# Patient Record
Sex: Male | Born: 1950
Health system: Southern US, Community
[De-identification: ages and names within clinical notes are randomized; demographics above are authoritative.]

## PROBLEM LIST (undated history)

## (undated) DIAGNOSIS — C61 Malignant neoplasm of prostate: Secondary | ICD-10-CM

## (undated) DIAGNOSIS — E7849 Other hyperlipidemia: Secondary | ICD-10-CM

## (undated) DIAGNOSIS — J45909 Unspecified asthma, uncomplicated: Secondary | ICD-10-CM

## (undated) DIAGNOSIS — Z8249 Family history of ischemic heart disease and other diseases of the circulatory system: Secondary | ICD-10-CM

## (undated) DIAGNOSIS — R001 Bradycardia, unspecified: Secondary | ICD-10-CM

## (undated) DIAGNOSIS — G5710 Meralgia paresthetica, unspecified lower limb: Secondary | ICD-10-CM

## (undated) DIAGNOSIS — B399 Histoplasmosis, unspecified: Secondary | ICD-10-CM

## (undated) DIAGNOSIS — H9191 Unspecified hearing loss, right ear: Secondary | ICD-10-CM

## (undated) HISTORY — DX: Malignant neoplasm of prostate: C61

## (undated) HISTORY — DX: Meralgia paresthetica, unspecified lower limb: G57.10

## (undated) HISTORY — DX: Histoplasmosis, unspecified: B39.9

## (undated) HISTORY — DX: Unspecified asthma, uncomplicated: J45.909

## (undated) HISTORY — PX: OTHER SURGICAL HISTORY: SHX169

## (undated) HISTORY — DX: Bradycardia, unspecified: R00.1

## (undated) HISTORY — DX: Family history of ischemic heart disease and other diseases of the circulatory system: Z82.49

## (undated) HISTORY — DX: Unspecified hearing loss, right ear: H91.91

## (undated) HISTORY — DX: Other hyperlipidemia: E78.49

---

## 2010-01-08 DIAGNOSIS — C61 Malignant neoplasm of prostate: Secondary | ICD-10-CM

## 2010-01-08 HISTORY — DX: Malignant neoplasm of prostate: C61

## 2012-07-07 DIAGNOSIS — C61 Malignant neoplasm of prostate: Secondary | ICD-10-CM | POA: Insufficient documentation

## 2012-07-07 DIAGNOSIS — N529 Male erectile dysfunction, unspecified: Secondary | ICD-10-CM | POA: Insufficient documentation

## 2013-09-18 ENCOUNTER — Other Ambulatory Visit: Payer: Self-pay | Admitting: Cardiology

## 2013-09-18 DIAGNOSIS — R6889 Other general symptoms and signs: Secondary | ICD-10-CM

## 2013-09-23 ENCOUNTER — Other Ambulatory Visit: Payer: Self-pay

## 2013-09-27 ENCOUNTER — Other Ambulatory Visit: Payer: BC Managed Care – PPO

## 2013-09-27 DIAGNOSIS — R6889 Other general symptoms and signs: Secondary | ICD-10-CM

## 2013-09-28 LAB — VITAMIN D 25 HYDROXY (VIT D DEFICIENCY, FRACTURES): Vit D, 25-Hydroxy: 66 ng/mL (ref 30–89)

## 2013-10-19 ENCOUNTER — Encounter: Payer: Self-pay | Admitting: Cardiology

## 2013-10-21 ENCOUNTER — Encounter: Payer: Self-pay | Admitting: Cardiology

## 2013-10-21 ENCOUNTER — Ambulatory Visit (INDEPENDENT_AMBULATORY_CARE_PROVIDER_SITE_OTHER): Payer: BC Managed Care – PPO | Admitting: Cardiology

## 2013-10-21 VITALS — BP 118/68 | HR 51 | Ht 70.0 in | Wt 162.0 lb

## 2013-10-21 DIAGNOSIS — E7849 Other hyperlipidemia: Secondary | ICD-10-CM

## 2013-10-21 DIAGNOSIS — H9190 Unspecified hearing loss, unspecified ear: Secondary | ICD-10-CM

## 2013-10-21 DIAGNOSIS — E785 Hyperlipidemia, unspecified: Secondary | ICD-10-CM

## 2013-10-21 DIAGNOSIS — H9191 Unspecified hearing loss, right ear: Secondary | ICD-10-CM

## 2013-10-21 DIAGNOSIS — Z8249 Family history of ischemic heart disease and other diseases of the circulatory system: Secondary | ICD-10-CM | POA: Insufficient documentation

## 2013-10-21 DIAGNOSIS — R001 Bradycardia, unspecified: Secondary | ICD-10-CM

## 2013-10-21 DIAGNOSIS — Z0389 Encounter for observation for other suspected diseases and conditions ruled out: Secondary | ICD-10-CM

## 2013-10-21 DIAGNOSIS — I498 Other specified cardiac arrhythmias: Secondary | ICD-10-CM

## 2013-10-21 HISTORY — DX: Other hyperlipidemia: E78.49

## 2013-10-21 HISTORY — DX: Unspecified hearing loss, right ear: H91.91

## 2013-10-21 HISTORY — DX: Family history of ischemic heart disease and other diseases of the circulatory system: Z82.49

## 2013-10-21 HISTORY — DX: Bradycardia, unspecified: R00.1

## 2013-10-21 LAB — LIPID PANEL
HDL: 55.7 mg/dL (ref >39.00)
LDL Cholesterol: 85 mg/dL (ref 0–99)
Total CHOL/HDL Ratio: 3
Triglycerides: 60 mg/dL (ref 0.0–149.0)

## 2013-10-21 NOTE — Patient Instructions (Signed)
Your physician recommends that you have labs today: Lipid  Your physician wants you to follow-up in: 1 year with Dr. Anne Fu. You will receive a reminder letter in the mail two months in advance. If you don't receive a letter, please call our office to schedule the follow-up appointment.

## 2013-10-21 NOTE — Progress Notes (Signed)
      [address] [place], [place]  [address] Phone: [phone] Fax:  [phone]  Date:  10/21/2013   ID:  Rick Love, DOB [birth year], MRN [id number]  PCP:  Ethel Rana   History of Present Illness: Rick Love is a 62 y.o. male familial hyperlipidemia, LDL 255 untreated, family history of CAD with brother having MI at age 55 here for followup.  Nuclear stress test 06/06/11-low risk but no ischemia. ST segments were noted during stress test which overall was false positive.  Crestor. Compliant. Doing well. Familial hyperlipidemia  Earlier this week, BP increased 151/75. He's been exercising. At times notes his heart rate increase to the 170 range. He has aspirations to climb [place]. [place]. Wife is in [place] currently.   Wt Readings from Last 3 Encounters:  10/21/13 162 lb (73.483 kg)     Past Medical History  Diagnosis Date  . Prostate cancer 01/08/2010  . Histoplasmosis   . Asthma   . Meralgia paresthetica     No past surgical history on file.  Current Outpatient Prescriptions  Medication Sig Dispense Refill  . aspirin 81 MG tablet Take 81 mg by mouth daily.      Marland Kitchen azelastine (ASTELIN) 137 MCG/SPRAY nasal spray Place 1 spray into the nose 2 (two) times daily. Use in each nostril as directed      . cetirizine (ZYRTEC) 5 MG tablet Take 5 mg by mouth daily.      . cholecalciferol (VITAMIN D) 1000 UNITS tablet Take by mouth 2 (two) times daily.      . Coenzyme Q10 (CO Q-10) 100 MG CAPS Take by mouth.      . loratadine (CLARITIN) 10 MG tablet Take 10 mg by mouth daily.      . rosuvastatin (CRESTOR) 20 MG tablet Take 20 mg by mouth daily.       No current facility-administered medications for this visit.    Allergies:   No Known Allergies  Social History:  The patient     ROS:  Please see the history of present illness.   Denies chest pain, syncope, orthopnea, PND, bleeding. He did have acute hearing loss right ear which is  improving.  PHYSICAL EXAM: VS:  BP 118/68  Pulse 51  Ht 5\' 10"  (1.778 m)  Wt 162 lb (73.483 kg)  BMI 23.24 kg/m2 Well nourished, well developed, in no acute distress HEENT: normal Neck: no JVD Cardiac:  normal S1, S2; RRR; no murmur Lungs:  clear to auscultation bilaterally, no wheezing, rhonchi or rales Abd: soft, nontender, no hepatomegaly Ext: no edema Skin: warm and dry Neuro: no focal abnormalities noted  EKG:  Sinus bradycardia rate 51 with nonspecific T-wave changes, no changes.  ASSESSMENT AND PLAN:  1. Strong family history of CAD-continue with primary prevention, fitness, exercise. 2. Hyperlipidemia-likely familial-continue with Crestor. Checking lipid profile 3. Sinus bradycardia-normal variant for him. Asymptomatic. 4. Transiently elevated blood pressure. Not hypertension. Continue to monitor. Unknown etiology. 5. Right-sided hearing loss-improving question viral neuritis 6. We will see back in one year.  Signed, Donato Schultz, MD [facility]  10/21/2013 12:23 PM

## 2013-10-28 ENCOUNTER — Other Ambulatory Visit: Payer: Self-pay

## 2013-11-01 NOTE — Progress Notes (Signed)
Left message for patient to call the office

## 2013-11-26 ENCOUNTER — Ambulatory Visit: Payer: Self-pay | Admitting: Cardiology

## 2014-10-28 ENCOUNTER — Encounter: Payer: Self-pay | Admitting: Cardiology

## 2014-10-28 ENCOUNTER — Ambulatory Visit (INDEPENDENT_AMBULATORY_CARE_PROVIDER_SITE_OTHER): Payer: BC Managed Care – PPO | Admitting: Cardiology

## 2014-10-28 VITALS — BP 126/72 | HR 58 | Ht 70.0 in | Wt 165.0 lb

## 2014-10-28 DIAGNOSIS — E7849 Other hyperlipidemia: Secondary | ICD-10-CM

## 2014-10-28 DIAGNOSIS — E785 Hyperlipidemia, unspecified: Secondary | ICD-10-CM

## 2014-10-28 DIAGNOSIS — R001 Bradycardia, unspecified: Secondary | ICD-10-CM

## 2014-10-28 DIAGNOSIS — Z8249 Family history of ischemic heart disease and other diseases of the circulatory system: Secondary | ICD-10-CM

## 2014-10-28 DIAGNOSIS — R5383 Other fatigue: Secondary | ICD-10-CM

## 2014-10-28 NOTE — Progress Notes (Signed)
[address], [address] [place], [place]  [address] Phone: [phone] Fax:  [phone]  Date:  10/28/2014   ID:  Rick Love, DOB [birth year], MRN [id number]  PCP:  Beatris Si   History of Present Illness: Rick Love is a 63 y.o. male familial hyperlipidemia, LDL 72 untreated, family history of CAD with brother having MI at age 76 here for followup.  Nuclear stress test 06/06/11-low risk but no ischemia. ST segments were noted during stress test which overall was false positive.  Crestor. Compliant. Doing well. Familial hyperlipidemia.  Earlier this week, BP increased 151/75. He's been exercising. At times notes his heart rate increase to the 170 range. He has aspirations to climb [place]. [place]. Wife is in [place] currently.  During exercise he has been feeling more fatigued, mild shortness of breath. He climbed [place] and his heart rate was in zone 4 for quite an extended period of time.it has been taking more time for his heart rate to decrease.    Wt Readings from Last 3 Encounters:  10/28/14 165 lb (74.844 kg)  10/21/13 162 lb (73.483 kg)     Past Medical History  Diagnosis Date  . Prostate cancer 01/08/2010  . Histoplasmosis   . Asthma   . Meralgia paresthetica   . Familial hyperlipidemia 10/21/2013    225 untreated LDL  . Family history of ischemic heart disease 10/21/2013  . Hearing loss in right ear 10/21/2013    Transient  . Sinus bradycardia 10/21/2013    Asymptomatic    No past surgical history on file.  Current Outpatient Prescriptions  Medication Sig Dispense Refill  . aspirin 81 MG tablet Take 81 mg by mouth daily.    . cetirizine (ZYRTEC) 5 MG tablet Take 5 mg by mouth daily.    . cholecalciferol (VITAMIN D) 1000 UNITS tablet Take by mouth 2 (two) times daily.    . Coenzyme Q10 (CO Q-10) 100 MG CAPS Take by mouth.    Marland Kitchen ipratropium (ATROVENT) 0.06 % nasal spray     . loratadine (CLARITIN) 10 MG tablet Take 10 mg by mouth  daily.    Marland Kitchen NASONEX 50 MCG/ACT nasal spray     . rosuvastatin (CRESTOR) 20 MG tablet Take 20 mg by mouth daily.    . SYMBICORT 160-4.5 MCG/ACT inhaler      No current facility-administered medications for this visit.    Allergies:   No Known Allergies  Social History:  The patient  reports that he has never smoked. He does not have any smokeless tobacco history on file.   ROS:  Please see the history of present illness.   Denies chest pain, syncope, orthopnea, PND, bleeding. He did have acute hearing loss right ear which is improving.  PHYSICAL EXAM: VS:  BP 126/72 mmHg  Pulse 58  Ht 5\' 10"  (1.778 m)  Wt 165 lb (74.844 kg)  BMI 23.68 kg/m2 Well nourished, well developed, in no acute distress HEENT: normal Neck: no JVD Cardiac:  normal S1, S2; RRR; no murmur Lungs:  clear to auscultation bilaterally, no wheezing, rhonchi or rales Abd: soft, nontender, no hepatomegaly Ext: no edema Skin: warm and dry Neuro: no focal abnormalities noted  EKG:  10/28/14-sinus bradycardia rate 58 with nonspecific ST-T wave changes, previouslySinus bradycardia rate 51 with nonspecific T-wave changes, no changes.  Labs: 10/21/13-LDL 85  ASSESSMENT AND PLAN:  1. Strong family history of CAD-continue with primary prevention, fitness, exercise. 2. Increasing fatigue/heart rate changes-I  will check an exercise treadmill test. Make sure that this is not a manifestation of ischemia. 3. Hyperlipidemia-likely familial-continue with Crestor. LDL 85. Excellent. 4. Sinus bradycardia-normal variant for him. Asymptomatic. 5. Right-sided hearing loss-improving question viral neuritis 6. We will see back in 6 months.  Signed, Candee Furbish, MD [facility]  10/28/2014 2:02 PM

## 2014-10-28 NOTE — Patient Instructions (Signed)
The current medical regimen is effective;  continue present plan and medications.  Your physician has requested that you have an exercise tolerance test. For further information please visit [email or web address]. Please also follow instruction sheet, as given.  Follow up in 6 months with Dr. Marlou Porch.  You will receive a letter in the mail 2 months before you are due.  Please call us when you receive this letter to schedule your follow up appointment.

## 2014-12-08 ENCOUNTER — Encounter: Payer: Self-pay | Admitting: *Deleted

## 2014-12-09 ENCOUNTER — Telehealth: Payer: Self-pay | Admitting: *Deleted

## 2014-12-09 DIAGNOSIS — Z8249 Family history of ischemic heart disease and other diseases of the circulatory system: Secondary | ICD-10-CM

## 2014-12-09 DIAGNOSIS — E785 Hyperlipidemia, unspecified: Secondary | ICD-10-CM

## 2014-12-09 NOTE — Telephone Encounter (Signed)
Pt is scheduled for a GXT on Monday.  He has in the past had a GXT with ST segment changes with exercise.  Due to this, his testing needs to be changed from a regular GXT to a nuclear study.  Left message for pt to call back to discuss.

## 2014-12-12 ENCOUNTER — Encounter: Payer: BC Managed Care – PPO | Admitting: Physician Assistant

## 2014-12-12 NOTE — Telephone Encounter (Signed)
Follow Up ° ° ° ° ° ° ° °Pt returning phone call °

## 2014-12-12 NOTE — Telephone Encounter (Signed)
Pt aware GXT for today should be cancelled and he is to be scheduled for a myoview.  Reviewed instructions over the phone.  He is aware he will be called to schedule the appt.

## 2014-12-15 ENCOUNTER — Ambulatory Visit (HOSPITAL_COMMUNITY): Payer: BC Managed Care – PPO | Attending: Cardiology | Admitting: Radiology

## 2014-12-15 DIAGNOSIS — Z8249 Family history of ischemic heart disease and other diseases of the circulatory system: Secondary | ICD-10-CM

## 2014-12-15 DIAGNOSIS — E785 Hyperlipidemia, unspecified: Secondary | ICD-10-CM

## 2014-12-15 DIAGNOSIS — R0600 Dyspnea, unspecified: Secondary | ICD-10-CM | POA: Diagnosis not present

## 2014-12-15 DIAGNOSIS — Z6822 Body mass index (BMI) 22.0-22.9, adult: Secondary | ICD-10-CM | POA: Diagnosis not present

## 2014-12-15 DIAGNOSIS — Z136 Encounter for screening for cardiovascular disorders: Secondary | ICD-10-CM | POA: Diagnosis present

## 2014-12-15 DIAGNOSIS — R0602 Shortness of breath: Secondary | ICD-10-CM | POA: Insufficient documentation

## 2014-12-15 MED ORDER — TECHNETIUM TC 99M SESTAMIBI GENERIC - CARDIOLITE
30.0000 | Freq: Once | INTRAVENOUS | Status: AC | PRN
  Administered 2014-12-15: 30 via INTRAVENOUS

## 2014-12-15 MED ORDER — TECHNETIUM TC 99M SESTAMIBI GENERIC - CARDIOLITE
10.0000 | Freq: Once | INTRAVENOUS | Status: AC | PRN
  Administered 2014-12-15: 10 via INTRAVENOUS

## 2014-12-15 NOTE — Progress Notes (Signed)
[facility] [address] [place], [place] [address] [phone]    Cardiology Nuclear Med Study  Rick Love is a 63 y.o. male     MRN : [id number]     DOB: 13-May-1951  Procedure Date: 12/15/2014  Nuclear Med Background Indication for Stress Test:  Evaluation for Ischemia History:  No Cardiac History Cardiac Risk Factors: N/A  Symptoms:  DOE and SOB   Nuclear Pre-Procedure Caffeine/Decaff Intake:  None NPO After: 7:00pm   Lungs:  clear O2 Sat: 98% on room air. IV 0.9% NS with Angio Cath:  22g  IV Site: R Hand  IV Started by:  Crissie Figures, RN  Chest Size (in):  42 Cup Size: n/a  Height: 5\' 10"  (1.778 m)  Weight:  158 lb (71.668 kg)  BMI:  Body mass index is 22.67 kg/(m^2). Tech Comments:  N/A    Nuclear Med Study 1 or 2 day study: 1 day  Stress Test Type:  Stress  Reading MD: N/A  Order Authorizing Provider:  Candee Furbish, MD  Resting Radionuclide: Technetium 75m Sestamibi  Resting Radionuclide Dose: 11.0 mCi   Stress Radionuclide:  Technetium 74m Sestamibi  Stress Radionuclide Dose: 33.0 mCi           Stress Protocol Rest HR: 47 Stress HR: 150  Rest BP: 131/77 Stress BP: 162/69  Exercise Time (min): 8:25 METS: 10.1   Predicted Max HR: 159 bpm % Max HR: 94.34 bpm Rate Pressure Product: 24300   Dose of Adenosine (mg):  n/a Dose of Lexiscan: n/a mg  Dose of Atropine (mg): n/a Dose of Dobutamine: n/a mcg/kg/min (at max HR)  Stress Test Technologist: Crissie Figures, RN  Nuclear Technologist:  Vedia Pereyra, CNMT     Rest Procedure:  Myocardial perfusion imaging was performed at rest 45 minutes following the intravenous administration of Technetium 37m Sestamibi. Rest ECG: NSR-LVH  Stress Procedure:  The patient exercised on the treadmill utilizing the Bruce Protocol for 8:25 minutes. The patient stopped due to achieving target heart rate and denied any chest pain.  Technetium 33m Sestamibi was injected at peak exercise and  myocardial perfusion imaging was performed after a brief delay. Stress ECG: Significant ST abnormalities consistent with ischemia.  QPS Raw Data Images:  Normal; no motion artifact; normal heart/lung ratio. Stress Images:  Normal homogeneous uptake in all areas of the myocardium. Rest Images:  Normal homogeneous uptake in all areas of the myocardium. Subtraction (SDS):  No evidence of ischemia. Transient Ischemic Dilatation (Normal <1.22):  1.02 Lung/Heart Ratio (Normal <0.45):  0.31  Quantitative Gated Spect Images QGS EDV:  88 ml QGS ESV:  38 ml  Impression Exercise Capacity:  Fair exercise capacity. BP Response:  Normal blood pressure response. Clinical Symptoms:  No significant symptoms noted. ECG Impression:  Significant ST abnormalities consistent with ischemia. Comparison with Prior Nuclear Study: No images to compare  Overall Impression:  Low risk stress nuclear study Normal EF and perfusion images  2 mm ST segment depression in inferior leads with stress Baseline ECG suggests LVH.  Consider further w/u such as cardiac CT if clinically indicated .  LV Ejection Fraction: 57%.  LV Wall Motion:  NL LV Function; NL Wall Motion   Jenkins [place]

## 2014-12-21 ENCOUNTER — Telehealth: Payer: Self-pay | Admitting: Cardiology

## 2014-12-21 NOTE — Telephone Encounter (Signed)
Reviewed results of stress test with pt who states understanding

## 2014-12-21 NOTE — Telephone Encounter (Signed)
New Msg ° ° ° ° ° ° °Pt returning call. ° ° °Please call back. °

## 2015-09-02 DIAGNOSIS — J309 Allergic rhinitis, unspecified: Secondary | ICD-10-CM

## 2015-09-02 DIAGNOSIS — K219 Gastro-esophageal reflux disease without esophagitis: Secondary | ICD-10-CM

## 2015-09-02 DIAGNOSIS — H1010 Acute atopic conjunctivitis, unspecified eye: Secondary | ICD-10-CM

## 2015-09-02 DIAGNOSIS — J45909 Unspecified asthma, uncomplicated: Secondary | ICD-10-CM | POA: Insufficient documentation

## 2015-09-20 ENCOUNTER — Ambulatory Visit (INDEPENDENT_AMBULATORY_CARE_PROVIDER_SITE_OTHER): Payer: BLUE CROSS/BLUE SHIELD | Admitting: *Deleted

## 2015-09-20 DIAGNOSIS — H1010 Acute atopic conjunctivitis, unspecified eye: Secondary | ICD-10-CM | POA: Diagnosis not present

## 2015-09-20 DIAGNOSIS — J309 Allergic rhinitis, unspecified: Secondary | ICD-10-CM

## 2015-09-25 ENCOUNTER — Other Ambulatory Visit: Payer: Self-pay | Admitting: *Deleted

## 2015-09-25 MED ORDER — MOMETASONE FUROATE 50 MCG/ACT NA SUSP: 1.0000 | Freq: Two times a day (BID) | NASAL | Status: DC

## 2015-10-03 ENCOUNTER — Ambulatory Visit (INDEPENDENT_AMBULATORY_CARE_PROVIDER_SITE_OTHER): Payer: BLUE CROSS/BLUE SHIELD

## 2015-10-03 DIAGNOSIS — J309 Allergic rhinitis, unspecified: Secondary | ICD-10-CM | POA: Diagnosis not present

## 2015-10-03 DIAGNOSIS — H1010 Acute atopic conjunctivitis, unspecified eye: Secondary | ICD-10-CM

## 2015-10-17 ENCOUNTER — Ambulatory Visit (INDEPENDENT_AMBULATORY_CARE_PROVIDER_SITE_OTHER): Payer: BLUE CROSS/BLUE SHIELD

## 2015-10-17 DIAGNOSIS — J309 Allergic rhinitis, unspecified: Secondary | ICD-10-CM

## 2015-10-31 ENCOUNTER — Ambulatory Visit (INDEPENDENT_AMBULATORY_CARE_PROVIDER_SITE_OTHER): Payer: BLUE CROSS/BLUE SHIELD | Admitting: *Deleted

## 2015-10-31 DIAGNOSIS — J309 Allergic rhinitis, unspecified: Secondary | ICD-10-CM

## 2015-11-02 DIAGNOSIS — J3089 Other allergic rhinitis: Secondary | ICD-10-CM

## 2015-11-03 DIAGNOSIS — J3081 Allergic rhinitis due to animal (cat) (dog) hair and dander: Secondary | ICD-10-CM

## 2015-11-21 ENCOUNTER — Ambulatory Visit (INDEPENDENT_AMBULATORY_CARE_PROVIDER_SITE_OTHER): Payer: BLUE CROSS/BLUE SHIELD

## 2015-11-21 DIAGNOSIS — J309 Allergic rhinitis, unspecified: Secondary | ICD-10-CM | POA: Diagnosis not present

## 2015-12-06 ENCOUNTER — Encounter: Payer: Self-pay | Admitting: Allergy and Immunology

## 2015-12-06 ENCOUNTER — Ambulatory Visit (INDEPENDENT_AMBULATORY_CARE_PROVIDER_SITE_OTHER): Payer: BLUE CROSS/BLUE SHIELD | Admitting: Allergy and Immunology

## 2015-12-06 VITALS — BP 130/80 | HR 64 | Resp 16

## 2015-12-06 DIAGNOSIS — K219 Gastro-esophageal reflux disease without esophagitis: Secondary | ICD-10-CM

## 2015-12-06 DIAGNOSIS — Z23 Encounter for immunization: Secondary | ICD-10-CM | POA: Diagnosis not present

## 2015-12-06 DIAGNOSIS — J387 Other diseases of larynx: Secondary | ICD-10-CM

## 2015-12-06 DIAGNOSIS — J4520 Mild intermittent asthma, uncomplicated: Secondary | ICD-10-CM

## 2015-12-06 DIAGNOSIS — J309 Allergic rhinitis, unspecified: Secondary | ICD-10-CM | POA: Diagnosis not present

## 2015-12-06 DIAGNOSIS — H1010 Acute atopic conjunctivitis, unspecified eye: Secondary | ICD-10-CM

## 2015-12-06 MED ORDER — ALBUTEROL SULFATE HFA 108 (90 BASE) MCG/ACT IN AERS: INHALATION_SPRAY | RESPIRATORY_TRACT | Status: AC

## 2015-12-06 NOTE — Patient Instructions (Addendum)
  1. Flu vaccine administered in clinic today  2. Continue omeprazole 40 mg in the morning and ranitidine 300 mg in the evening  3. Continue Nasonex one-2 sprays each nostril one time per day  4. Continue nasal ipratropium 0.06% 2 sprays each nostril every 6 hours if needed  5. Continue Zyrtec, Claritin, Proventil HFA if needed  6. Continue immunotherapy and EpiPen  7. Return to clinic in 1 year or earlier if problem

## 2015-12-06 NOTE — Progress Notes (Addendum)
[facility]  Follow-up Note  Refering Provider: Corine Shelter, PA-C Primary Provider: Beatris Si  Subjective:   Rick Love is a 64 y.o. male who returns to the [facility] [place] in re-evaluation of the following:  HPI Comments:  Patric returns to this clinic on 12/15/2015 in reevaluation of his allergic rhinoconjunctivitis treated with immunotherapy. This is been his best year ever for his allergies. He continues to use immunotherapy every 3 weeks has had very little problems with his nose as long as he continues to use an antihistamine at nighttime. He uses Zyrtec and Claritin. He also continues to use Nasonex one spray shot also one time per day. In the morning he uses nasal ipratropium spray which works great and drying up his nose. Prior to using this now spray he had to use a box of tissues when he ate food but now he has no problem at all. He's had very little problems with his chest. Occasionally he'll get some sneezing and then he thinks he may get some wheezing about every third day for which he'll occasionally take a Benadryl. He does not use a short acting bronchodilator. He can exercise. It should be noted that he did have histoplasmosis during childhood and he's had a persistently depressed spirometry ever since he is been seen in this clinic which does not correlate with his lack of symptoms regarding bronchospastic disease and lack of exacerbations regarding bronchospastic disease   Outpatient Encounter Prescriptions as of 12/06/2015  Medication Sig  . aspirin 81 MG tablet Take 81 mg by mouth daily.  . cetirizine (ZYRTEC) 5 MG tablet Take 5 mg by mouth daily.  . cholecalciferol (VITAMIN D) 1000 UNITS tablet Take by mouth 2 (two) times daily.  Marland Kitchen EPINEPHrine (EPIPEN 2-PAK) 0.3 mg/0.3 mL IJ SOAJ injection Inject 0.3 mg into the muscle once.  Marland Kitchen ipratropium (ATROVENT) 0.06 % nasal spray   . loratadine  (CLARITIN) 10 MG tablet Take 10 mg by mouth daily.  . mometasone (NASONEX) 50 MCG/ACT nasal spray Place 1 spray into the nose 2 (two) times daily.  Marland Kitchen omeprazole (PRILOSEC) 40 MG capsule Take 40 mg by mouth daily.  . ranitidine (ZANTAC) 300 MG tablet Take 300 mg by mouth at bedtime.  . rosuvastatin (CRESTOR) 20 MG tablet Take 20 mg by mouth daily.  . SYMBICORT 160-4.5 MCG/ACT inhaler   . [DISCONTINUED] Umeclidinium-Vilanterol (ANORO ELLIPTA) 62.5-25 MCG/INH AEPB Inhale 1 puff into the lungs daily.  Marland Kitchen albuterol (PROVENTIL HFA) 108 (90 BASE) MCG/ACT inhaler INHALE TWO PUFFS EVERY FOUR TO SIX HOURS AS NEEDED FOR COUGH OR WHEEZE.  Marland Kitchen Coenzyme Q10 (CO Q-10) 100 MG CAPS Take by mouth. Reported on 12/06/2015   No facility-administered encounter medications on file as of 12/06/2015.    Meds ordered this encounter  Medications  . albuterol (PROVENTIL HFA) 108 (90 BASE) MCG/ACT inhaler    Sig: INHALE TWO PUFFS EVERY FOUR TO SIX HOURS AS NEEDED FOR COUGH OR WHEEZE.    Dispense:  1 Inhaler    Refill:  1    Past Medical History  Diagnosis Date  . Prostate cancer ([place]) 01/08/2010  . Histoplasmosis   . Asthma   . Meralgia paresthetica   . Familial hyperlipidemia 10/21/2013    225 untreated LDL  . Family history of ischemic heart disease 10/21/2013  . Hearing loss in right ear 10/21/2013    Transient  . Sinus bradycardia 10/21/2013    Asymptomatic  Past Surgical History  Procedure Laterality Date  . No surgical hx      No Known Allergies  Review of Systems  Constitutional: Negative.   HENT: Negative.   Eyes: Negative.   Respiratory: Negative.   Cardiovascular: Negative.   Gastrointestinal: Negative.   Musculoskeletal: Negative.   Skin: Negative.      Objective:   Filed Vitals:   12/06/15 1105  BP: 130/80  Pulse: 64  Resp: 16          Physical Exam  Constitutional: He appears well-developed and well-nourished. No distress.  HENT:  Head: Normocephalic and  atraumatic. Head is without right periorbital erythema and without left periorbital erythema.  Right Ear: Tympanic membrane, external ear and ear canal normal. No drainage or tenderness. No foreign bodies. Tympanic membrane is not injected, not scarred, not perforated, not erythematous, not retracted and not bulging. No middle ear effusion.  Left Ear: Tympanic membrane, external ear and ear canal normal. No drainage or tenderness. No foreign bodies. Tympanic membrane is not injected, not scarred, not perforated, not erythematous, not retracted and not bulging.  No middle ear effusion.  Nose: Nose normal. No mucosal edema, rhinorrhea, nose lacerations or sinus tenderness.  No foreign bodies.  Mouth/Throat: Oropharynx is clear and moist. No oropharyngeal exudate, posterior oropharyngeal edema, posterior oropharyngeal erythema or tonsillar abscesses.  Eyes: Lids are normal. Right eye exhibits no chemosis, no discharge and no exudate. No foreign body present in the right eye. Left eye exhibits no chemosis, no discharge and no exudate. No foreign body present in the left eye. Right conjunctiva is not injected. Left conjunctiva is not injected.  Neck: Neck supple. No tracheal tenderness present. No tracheal deviation and no edema present. No thyroid mass and no thyromegaly present.  Cardiovascular: Normal rate, regular rhythm, S1 normal and S2 normal.  Exam reveals no gallop.   No murmur heard. Pulmonary/Chest: No accessory muscle usage or stridor. No respiratory distress. He has no wheezes. He has no rhonchi. He has no rales.  Abdominal: Soft.  Lymphadenopathy:       Head (right side): No tonsillar adenopathy present.       Head (left side): No tonsillar adenopathy present.    He has no cervical adenopathy.  Neurological: He is alert.  Skin: No rash noted. He is not diaphoretic.  Psychiatric: He has a normal mood and affect. His behavior is normal.    Diagnostics:    Spirometry was performed and  demonstrated an FEV1 of 2.44 at 75 % of predicted.  The patient had an Asthma Control Test with the following results: ACT Total Score: 23.    Assessment and Plan:   1. Allergic rhinoconjunctivitis   2. Laryngopharyngeal reflux (LPR)   3. Asthma, mild intermittent, uncomplicated   4. Need for prophylactic vaccination and inoculation against influenza      1. Flu vaccine administered in clinic today  2. Continue omeprazole 40 mg in the morning and ranitidine 300 mg in the evening  3. Continue Nasonex one-2 sprays each nostril one time per day  4. Continue nasal ipratropium 0.06% 2 sprays each nostril every 6 hours if needed  5. Continue Zyrtec, Claritin, Proventil HFA if needed  6. Continue immunotherapy and EpiPen  7. Return to clinic in 1 year or earlier if problem  Overall Olufemi is doing quite well on his current medical therapy and we'll continue to have him use therapy directed against his atopic disease including immunotherapy and therapy directed against reflux. I  will see him back in this clinic in 6 months or earlier if there is a problem.   Allena Katz, MD [facility]

## 2015-12-12 ENCOUNTER — Ambulatory Visit (INDEPENDENT_AMBULATORY_CARE_PROVIDER_SITE_OTHER): Payer: BLUE CROSS/BLUE SHIELD

## 2015-12-12 DIAGNOSIS — J309 Allergic rhinitis, unspecified: Secondary | ICD-10-CM | POA: Diagnosis not present

## 2016-01-06 ENCOUNTER — Encounter: Payer: Self-pay | Admitting: Allergy and Immunology

## 2016-01-08 ENCOUNTER — Other Ambulatory Visit: Payer: Self-pay

## 2016-01-08 ENCOUNTER — Other Ambulatory Visit: Payer: Self-pay | Admitting: *Deleted

## 2016-01-08 MED ORDER — IPRATROPIUM BROMIDE 0.06 % NA SOLN: 2.0000 | Freq: Four times a day (QID) | NASAL | Status: DC | PRN

## 2016-01-08 MED ORDER — OMEPRAZOLE 40 MG PO CPDR: 40.0000 mg | DELAYED_RELEASE_CAPSULE | Freq: Every day | ORAL | Status: DC

## 2016-01-08 NOTE — Telephone Encounter (Signed)
Called and left message for patient advising Rx sent to [facility] for Ipratropium nasal

## 2016-01-23 ENCOUNTER — Ambulatory Visit (INDEPENDENT_AMBULATORY_CARE_PROVIDER_SITE_OTHER): Payer: BLUE CROSS/BLUE SHIELD

## 2016-01-23 DIAGNOSIS — J309 Allergic rhinitis, unspecified: Secondary | ICD-10-CM

## 2016-02-13 ENCOUNTER — Ambulatory Visit (INDEPENDENT_AMBULATORY_CARE_PROVIDER_SITE_OTHER): Payer: BLUE CROSS/BLUE SHIELD

## 2016-02-13 DIAGNOSIS — J309 Allergic rhinitis, unspecified: Secondary | ICD-10-CM

## 2016-02-20 ENCOUNTER — Ambulatory Visit (INDEPENDENT_AMBULATORY_CARE_PROVIDER_SITE_OTHER): Payer: BLUE CROSS/BLUE SHIELD

## 2016-02-20 DIAGNOSIS — J309 Allergic rhinitis, unspecified: Secondary | ICD-10-CM

## 2016-02-28 ENCOUNTER — Ambulatory Visit (INDEPENDENT_AMBULATORY_CARE_PROVIDER_SITE_OTHER): Payer: BLUE CROSS/BLUE SHIELD

## 2016-02-28 DIAGNOSIS — J309 Allergic rhinitis, unspecified: Secondary | ICD-10-CM

## 2016-03-05 ENCOUNTER — Ambulatory Visit (INDEPENDENT_AMBULATORY_CARE_PROVIDER_SITE_OTHER): Payer: BLUE CROSS/BLUE SHIELD

## 2016-03-05 DIAGNOSIS — J309 Allergic rhinitis, unspecified: Secondary | ICD-10-CM

## 2016-03-12 ENCOUNTER — Ambulatory Visit (INDEPENDENT_AMBULATORY_CARE_PROVIDER_SITE_OTHER): Payer: BLUE CROSS/BLUE SHIELD

## 2016-03-12 DIAGNOSIS — J309 Allergic rhinitis, unspecified: Secondary | ICD-10-CM | POA: Diagnosis not present

## 2016-03-13 ENCOUNTER — Other Ambulatory Visit: Payer: Self-pay | Admitting: Allergy and Immunology

## 2016-03-28 DIAGNOSIS — D126 Benign neoplasm of colon, unspecified: Secondary | ICD-10-CM | POA: Insufficient documentation

## 2016-04-02 ENCOUNTER — Ambulatory Visit (INDEPENDENT_AMBULATORY_CARE_PROVIDER_SITE_OTHER): Payer: BLUE CROSS/BLUE SHIELD | Admitting: *Deleted

## 2016-04-02 DIAGNOSIS — J309 Allergic rhinitis, unspecified: Secondary | ICD-10-CM | POA: Diagnosis not present

## 2016-04-23 ENCOUNTER — Ambulatory Visit (INDEPENDENT_AMBULATORY_CARE_PROVIDER_SITE_OTHER): Payer: BLUE CROSS/BLUE SHIELD

## 2016-04-23 DIAGNOSIS — J309 Allergic rhinitis, unspecified: Secondary | ICD-10-CM | POA: Diagnosis not present

## 2016-05-09 DIAGNOSIS — J3089 Other allergic rhinitis: Secondary | ICD-10-CM | POA: Diagnosis not present

## 2016-05-10 DIAGNOSIS — J3081 Allergic rhinitis due to animal (cat) (dog) hair and dander: Secondary | ICD-10-CM | POA: Diagnosis not present

## 2016-05-14 ENCOUNTER — Ambulatory Visit (INDEPENDENT_AMBULATORY_CARE_PROVIDER_SITE_OTHER): Payer: BLUE CROSS/BLUE SHIELD | Admitting: *Deleted

## 2016-05-14 DIAGNOSIS — J309 Allergic rhinitis, unspecified: Secondary | ICD-10-CM | POA: Diagnosis not present

## 2016-06-04 ENCOUNTER — Ambulatory Visit (INDEPENDENT_AMBULATORY_CARE_PROVIDER_SITE_OTHER): Payer: BLUE CROSS/BLUE SHIELD | Admitting: *Deleted

## 2016-06-04 DIAGNOSIS — J309 Allergic rhinitis, unspecified: Secondary | ICD-10-CM

## 2016-06-26 ENCOUNTER — Ambulatory Visit (INDEPENDENT_AMBULATORY_CARE_PROVIDER_SITE_OTHER): Payer: BLUE CROSS/BLUE SHIELD

## 2016-06-26 DIAGNOSIS — J309 Allergic rhinitis, unspecified: Secondary | ICD-10-CM

## 2016-07-16 ENCOUNTER — Ambulatory Visit (INDEPENDENT_AMBULATORY_CARE_PROVIDER_SITE_OTHER): Payer: BLUE CROSS/BLUE SHIELD

## 2016-07-16 DIAGNOSIS — J309 Allergic rhinitis, unspecified: Secondary | ICD-10-CM

## 2016-08-06 ENCOUNTER — Ambulatory Visit (INDEPENDENT_AMBULATORY_CARE_PROVIDER_SITE_OTHER): Payer: BLUE CROSS/BLUE SHIELD | Admitting: *Deleted

## 2016-08-06 DIAGNOSIS — J309 Allergic rhinitis, unspecified: Secondary | ICD-10-CM | POA: Diagnosis not present

## 2016-08-07 DIAGNOSIS — J3089 Other allergic rhinitis: Secondary | ICD-10-CM | POA: Diagnosis not present

## 2016-08-08 DIAGNOSIS — J3081 Allergic rhinitis due to animal (cat) (dog) hair and dander: Secondary | ICD-10-CM | POA: Diagnosis not present

## 2016-08-13 ENCOUNTER — Ambulatory Visit (INDEPENDENT_AMBULATORY_CARE_PROVIDER_SITE_OTHER): Payer: BLUE CROSS/BLUE SHIELD

## 2016-08-13 DIAGNOSIS — J309 Allergic rhinitis, unspecified: Secondary | ICD-10-CM | POA: Diagnosis not present

## 2016-08-21 ENCOUNTER — Ambulatory Visit (INDEPENDENT_AMBULATORY_CARE_PROVIDER_SITE_OTHER): Payer: BLUE CROSS/BLUE SHIELD | Admitting: *Deleted

## 2016-08-21 DIAGNOSIS — J309 Allergic rhinitis, unspecified: Secondary | ICD-10-CM | POA: Diagnosis not present

## 2016-08-27 ENCOUNTER — Ambulatory Visit (INDEPENDENT_AMBULATORY_CARE_PROVIDER_SITE_OTHER): Payer: BLUE CROSS/BLUE SHIELD

## 2016-08-27 DIAGNOSIS — J309 Allergic rhinitis, unspecified: Secondary | ICD-10-CM

## 2016-09-03 ENCOUNTER — Ambulatory Visit (INDEPENDENT_AMBULATORY_CARE_PROVIDER_SITE_OTHER): Payer: BLUE CROSS/BLUE SHIELD | Admitting: *Deleted

## 2016-09-03 DIAGNOSIS — J309 Allergic rhinitis, unspecified: Secondary | ICD-10-CM | POA: Diagnosis not present

## 2016-09-24 ENCOUNTER — Ambulatory Visit (INDEPENDENT_AMBULATORY_CARE_PROVIDER_SITE_OTHER): Payer: BLUE CROSS/BLUE SHIELD

## 2016-09-24 DIAGNOSIS — J309 Allergic rhinitis, unspecified: Secondary | ICD-10-CM

## 2016-10-22 ENCOUNTER — Ambulatory Visit (INDEPENDENT_AMBULATORY_CARE_PROVIDER_SITE_OTHER): Payer: BLUE CROSS/BLUE SHIELD

## 2016-10-22 DIAGNOSIS — J309 Allergic rhinitis, unspecified: Secondary | ICD-10-CM

## 2016-11-07 ENCOUNTER — Ambulatory Visit (INDEPENDENT_AMBULATORY_CARE_PROVIDER_SITE_OTHER): Payer: BLUE CROSS/BLUE SHIELD | Admitting: *Deleted

## 2016-11-07 DIAGNOSIS — J309 Allergic rhinitis, unspecified: Secondary | ICD-10-CM

## 2016-11-26 ENCOUNTER — Ambulatory Visit: Payer: BLUE CROSS/BLUE SHIELD | Admitting: Allergy and Immunology

## 2016-12-04 ENCOUNTER — Ambulatory Visit: Payer: Self-pay

## 2016-12-04 ENCOUNTER — Ambulatory Visit (INDEPENDENT_AMBULATORY_CARE_PROVIDER_SITE_OTHER): Payer: BLUE CROSS/BLUE SHIELD | Admitting: Allergy and Immunology

## 2016-12-04 ENCOUNTER — Encounter: Payer: Self-pay | Admitting: Allergy and Immunology

## 2016-12-04 VITALS — BP 128/70 | HR 60 | Resp 16

## 2016-12-04 DIAGNOSIS — J4520 Mild intermittent asthma, uncomplicated: Secondary | ICD-10-CM | POA: Diagnosis not present

## 2016-12-04 DIAGNOSIS — J3089 Other allergic rhinitis: Secondary | ICD-10-CM

## 2016-12-04 DIAGNOSIS — J309 Allergic rhinitis, unspecified: Secondary | ICD-10-CM

## 2016-12-04 NOTE — Progress Notes (Signed)
Follow-up Note  Referring Provider: Corine Shelter, PA-C Primary Provider: Beatris Si Date of Office Visit: 12/04/2016  Subjective:   Rick Love (DOB: [birth year]) is a 65 y.o. male who returns to the [facility] on 12/04/2016 in re-evaluation of the following:  HPI: Rick Love returns to this clinic in reevaluation of his allergic rhinoconjunctivitis and distant history of asthma treated with immunotherapy. I last seen him in his clinic in 1 year ago.  He has had very little problems with his airway. He has not required a systemic steroid or an antibiotic to treat a problem with his airway. He did unfortunately contract influenza in October and was quite sick for about 10 days or so and had a lingering cough that when on for almost 2 months. He's basically resolved this issue at this point in time. He did not need to use a short acting bronchodilator during his acute illness but when he started to exercise on a treadmill he did use a short acting bronchodilator as he did have a little bit of cough and this did appear to help. Now he can exercise and not use a short acting bronchodilator. He's always been a little bit limited in his ability to exercise. This is been believed secondary to his pectus excavatum and his history of histoplasmosis during childhood.  His immunotherapy is going quite well. He's currently receiving immunotherapy every 3 weeks. He has not had any adverse effects as a result of receiving this form of treatment.  He continues to use nasal ipratropium which is working quite well regarding his chronic rhinorrhea. He also continues on Nasonex and antihistamines which is working well for his upper airway atopic disease.  He stopped his ipratropium and ranitidine as this really didn't help his throat clearing to any large degree and he does not have any classic reflux symptoms except when he overeats. He is using ranitidine now as needed which is averaging  out to about twice a month.  He did receive the flu vaccine this year    Medication List      albuterol 108 (90 Base) MCG/ACT inhaler Commonly known as:  PROVENTIL HFA INHALE TWO PUFFS EVERY FOUR TO SIX HOURS AS NEEDED FOR COUGH OR WHEEZE.   aspirin 81 MG tablet Take 81 mg by mouth daily.   cetirizine 5 MG tablet Commonly known as:  ZYRTEC Take 5 mg by mouth daily.   cholecalciferol 1000 units tablet Commonly known as:  VITAMIN D Take by mouth 2 (two) times daily.   EPIPEN 2-PAK 0.3 mg/0.3 mL Soaj injection Generic drug:  EPINEPHrine Inject 0.3 mg into the muscle once.   ipratropium 0.06 % nasal spray Commonly known as:  ATROVENT Place 2 sprays into both nostrils every 6 (six) hours as needed for rhinitis.   loratadine 10 MG tablet Commonly known as:  CLARITIN Take 10 mg by mouth daily.   mometasone 50 MCG/ACT nasal spray Commonly known as:  NASONEX USE ONE SPRAY NASALLY TWICE A DAY   omeprazole 40 MG capsule Commonly known as:  PRILOSEC Take 1 capsule (40 mg total) by mouth daily.   ranitidine 300 MG tablet Commonly known as:  ZANTAC Take 300 mg by mouth at bedtime.   rosuvastatin 20 MG tablet Commonly known as:  CRESTOR Take 20 mg by mouth daily.       Past Medical History:  Diagnosis Date  . Asthma   . Familial hyperlipidemia 10/21/2013   225 untreated LDL  .  Family history of ischemic heart disease 10/21/2013  . Hearing loss in right ear 10/21/2013   Transient  . Histoplasmosis   . Meralgia paresthetica   . Prostate cancer ([place]) 01/08/2010  . Sinus bradycardia 10/21/2013   Asymptomatic    Past Surgical History:  Procedure Laterality Date  . no surgical hx      Allergies  Allergen Reactions  . Tape Rash    Review of systems negative except as noted in HPI / PMHx or noted below:  Review of Systems  Constitutional: Negative.   HENT: Negative.   Eyes: Negative.   Respiratory: Negative.   Cardiovascular: Negative.     Gastrointestinal: Negative.   Genitourinary: Negative.   Musculoskeletal: Negative.   Skin: Negative.   Neurological: Negative.   Endo/Heme/Allergies: Negative.   Psychiatric/Behavioral: Negative.      Objective:   Vitals:   12/04/16 1101  BP: 128/70  Pulse: 60  Resp: 16          Physical Exam  Constitutional: He is well-developed, well-nourished, and in no distress.  HENT:  Head: Normocephalic.  Right Ear: Tympanic membrane, external ear and ear canal normal.  Left Ear: Tympanic membrane, external ear and ear canal normal.  Nose: Nose normal. No mucosal edema or rhinorrhea.  Mouth/Throat: Uvula is midline, oropharynx is clear and moist and mucous membranes are normal. No oropharyngeal exudate.  Eyes: Conjunctivae are normal.  Neck: Trachea normal. No tracheal tenderness present. No tracheal deviation present. No thyromegaly present.  Cardiovascular: Normal rate, regular rhythm, S1 normal, S2 normal and normal heart sounds.   No murmur heard. Pulmonary/Chest: Breath sounds normal. No stridor. No respiratory distress. He has no wheezes. He has no rales.  Pectus excavatum  Musculoskeletal: He exhibits no edema.  Lymphadenopathy:       Head (right side): No tonsillar adenopathy present.       Head (left side): No tonsillar adenopathy present.    He has no cervical adenopathy.  Neurological: He is alert. Gait normal.  Skin: No rash noted. He is not diaphoretic. No erythema. Nails show no clubbing.  Psychiatric: Mood and affect normal.    Diagnostics: None    Assessment and Plan:   1. Other allergic rhinitis   2. Asthma, mild intermittent, well-controlled     1. Continue Nasonex one-2 sprays each nostril one time per day  2. Continue nasal ipratropium 0.06% 2 sprays each nostril every 6 hours if needed  3. Continue Zyrtec, Claritin, Proventil HFA if needed  4. Continue immunotherapy and EpiPen  5. Return to clinic in 1 year or earlier if problem  Rick Love  appears to be doing relatively well at this point in time and we will continue to have him use the therapy mentioned above which includes immunotherapy. I will see him back in this clinic in 1 year or earlier if there is a problem.  Allena Katz, MD [place]

## 2016-12-04 NOTE — Patient Instructions (Addendum)
  1. Continue Nasonex one-2 sprays each nostril one time per day  2. Continue nasal ipratropium 0.06% 2 sprays each nostril every 6 hours if needed  3. Continue Zyrtec, Claritin, Proventil HFA if needed  4. Continue immunotherapy and EpiPen  5. Return to clinic in 1 year or earlier if problem

## 2016-12-24 ENCOUNTER — Ambulatory Visit (INDEPENDENT_AMBULATORY_CARE_PROVIDER_SITE_OTHER): Payer: BLUE CROSS/BLUE SHIELD | Admitting: *Deleted

## 2016-12-24 DIAGNOSIS — J309 Allergic rhinitis, unspecified: Secondary | ICD-10-CM | POA: Diagnosis not present

## 2017-01-14 ENCOUNTER — Ambulatory Visit (INDEPENDENT_AMBULATORY_CARE_PROVIDER_SITE_OTHER): Payer: BLUE CROSS/BLUE SHIELD | Admitting: *Deleted

## 2017-01-14 DIAGNOSIS — J309 Allergic rhinitis, unspecified: Secondary | ICD-10-CM | POA: Diagnosis not present

## 2017-01-15 DIAGNOSIS — M9903 Segmental and somatic dysfunction of lumbar region: Secondary | ICD-10-CM | POA: Diagnosis not present

## 2017-01-15 DIAGNOSIS — M5137 Other intervertebral disc degeneration, lumbosacral region: Secondary | ICD-10-CM | POA: Diagnosis not present

## 2017-01-15 DIAGNOSIS — M9904 Segmental and somatic dysfunction of sacral region: Secondary | ICD-10-CM | POA: Diagnosis not present

## 2017-01-15 DIAGNOSIS — M5431 Sciatica, right side: Secondary | ICD-10-CM | POA: Diagnosis not present

## 2017-01-15 NOTE — Addendum Note (Signed)
Addended byOralia Rud M on: 01/15/2017 02:54 PM   Modules accepted: Orders

## 2017-01-15 NOTE — Addendum Note (Signed)
Addended bySkeet Simmer, Williams Dietrick M on: 01/15/2017 03:00 PM   Modules accepted: Orders

## 2017-01-15 NOTE — Addendum Note (Signed)
Addended byOralia Rud M on: 01/15/2017 03:05 PM   Modules accepted: Orders

## 2017-01-17 DIAGNOSIS — M5431 Sciatica, right side: Secondary | ICD-10-CM | POA: Diagnosis not present

## 2017-01-17 DIAGNOSIS — M9904 Segmental and somatic dysfunction of sacral region: Secondary | ICD-10-CM | POA: Diagnosis not present

## 2017-01-17 DIAGNOSIS — M9903 Segmental and somatic dysfunction of lumbar region: Secondary | ICD-10-CM | POA: Diagnosis not present

## 2017-01-17 DIAGNOSIS — M5137 Other intervertebral disc degeneration, lumbosacral region: Secondary | ICD-10-CM | POA: Diagnosis not present

## 2017-01-21 ENCOUNTER — Ambulatory Visit (INDEPENDENT_AMBULATORY_CARE_PROVIDER_SITE_OTHER): Payer: BLUE CROSS/BLUE SHIELD

## 2017-01-21 DIAGNOSIS — J309 Allergic rhinitis, unspecified: Secondary | ICD-10-CM | POA: Diagnosis not present

## 2017-01-28 ENCOUNTER — Ambulatory Visit (INDEPENDENT_AMBULATORY_CARE_PROVIDER_SITE_OTHER): Payer: BLUE CROSS/BLUE SHIELD

## 2017-01-28 DIAGNOSIS — J309 Allergic rhinitis, unspecified: Secondary | ICD-10-CM

## 2017-02-04 ENCOUNTER — Ambulatory Visit (INDEPENDENT_AMBULATORY_CARE_PROVIDER_SITE_OTHER): Payer: BLUE CROSS/BLUE SHIELD | Admitting: *Deleted

## 2017-02-04 DIAGNOSIS — J309 Allergic rhinitis, unspecified: Secondary | ICD-10-CM

## 2017-02-11 ENCOUNTER — Ambulatory Visit (INDEPENDENT_AMBULATORY_CARE_PROVIDER_SITE_OTHER): Payer: BLUE CROSS/BLUE SHIELD

## 2017-02-11 DIAGNOSIS — J309 Allergic rhinitis, unspecified: Secondary | ICD-10-CM | POA: Diagnosis not present

## 2017-03-04 ENCOUNTER — Ambulatory Visit (INDEPENDENT_AMBULATORY_CARE_PROVIDER_SITE_OTHER): Payer: BLUE CROSS/BLUE SHIELD | Admitting: *Deleted

## 2017-03-04 DIAGNOSIS — J309 Allergic rhinitis, unspecified: Secondary | ICD-10-CM

## 2017-03-08 ENCOUNTER — Other Ambulatory Visit: Payer: Self-pay | Admitting: Allergy and Immunology

## 2017-03-25 ENCOUNTER — Ambulatory Visit (INDEPENDENT_AMBULATORY_CARE_PROVIDER_SITE_OTHER): Payer: BLUE CROSS/BLUE SHIELD | Admitting: *Deleted

## 2017-03-25 DIAGNOSIS — J309 Allergic rhinitis, unspecified: Secondary | ICD-10-CM

## 2017-04-15 ENCOUNTER — Ambulatory Visit (INDEPENDENT_AMBULATORY_CARE_PROVIDER_SITE_OTHER): Payer: BLUE CROSS/BLUE SHIELD | Admitting: *Deleted

## 2017-04-15 DIAGNOSIS — J309 Allergic rhinitis, unspecified: Secondary | ICD-10-CM

## 2017-04-30 NOTE — Addendum Note (Signed)
Addended by: Katherina Right D on: 04/30/2017 01:45 PM   Modules accepted: Orders

## 2017-05-02 ENCOUNTER — Encounter: Payer: Self-pay | Admitting: *Deleted

## 2017-05-02 DIAGNOSIS — J3089 Other allergic rhinitis: Secondary | ICD-10-CM | POA: Diagnosis not present

## 2017-05-02 NOTE — Progress Notes (Signed)
Maintenance vials made. Exp: 05-02-18. hc

## 2017-05-05 ENCOUNTER — Ambulatory Visit (INDEPENDENT_AMBULATORY_CARE_PROVIDER_SITE_OTHER): Payer: BLUE CROSS/BLUE SHIELD

## 2017-05-05 DIAGNOSIS — J309 Allergic rhinitis, unspecified: Secondary | ICD-10-CM

## 2017-05-27 ENCOUNTER — Ambulatory Visit (INDEPENDENT_AMBULATORY_CARE_PROVIDER_SITE_OTHER): Payer: BLUE CROSS/BLUE SHIELD | Admitting: *Deleted

## 2017-05-27 DIAGNOSIS — J309 Allergic rhinitis, unspecified: Secondary | ICD-10-CM

## 2017-06-24 ENCOUNTER — Ambulatory Visit (INDEPENDENT_AMBULATORY_CARE_PROVIDER_SITE_OTHER): Payer: BLUE CROSS/BLUE SHIELD

## 2017-06-24 DIAGNOSIS — J309 Allergic rhinitis, unspecified: Secondary | ICD-10-CM

## 2017-07-02 ENCOUNTER — Ambulatory Visit (INDEPENDENT_AMBULATORY_CARE_PROVIDER_SITE_OTHER): Payer: BLUE CROSS/BLUE SHIELD | Admitting: *Deleted

## 2017-07-02 DIAGNOSIS — E559 Vitamin D deficiency, unspecified: Secondary | ICD-10-CM | POA: Diagnosis not present

## 2017-07-02 DIAGNOSIS — E785 Hyperlipidemia, unspecified: Secondary | ICD-10-CM | POA: Diagnosis not present

## 2017-07-02 DIAGNOSIS — J309 Allergic rhinitis, unspecified: Secondary | ICD-10-CM | POA: Diagnosis not present

## 2017-07-02 DIAGNOSIS — Z418 Encounter for other procedures for purposes other than remedying health state: Secondary | ICD-10-CM | POA: Diagnosis not present

## 2017-07-02 DIAGNOSIS — Z0000 Encounter for general adult medical examination without abnormal findings: Secondary | ICD-10-CM | POA: Diagnosis not present

## 2017-07-02 DIAGNOSIS — C61 Malignant neoplasm of prostate: Secondary | ICD-10-CM | POA: Diagnosis not present

## 2017-07-08 ENCOUNTER — Ambulatory Visit (INDEPENDENT_AMBULATORY_CARE_PROVIDER_SITE_OTHER): Payer: BLUE CROSS/BLUE SHIELD | Admitting: *Deleted

## 2017-07-08 DIAGNOSIS — J309 Allergic rhinitis, unspecified: Secondary | ICD-10-CM

## 2017-07-15 ENCOUNTER — Ambulatory Visit (INDEPENDENT_AMBULATORY_CARE_PROVIDER_SITE_OTHER): Payer: BLUE CROSS/BLUE SHIELD | Admitting: *Deleted

## 2017-07-15 DIAGNOSIS — J309 Allergic rhinitis, unspecified: Secondary | ICD-10-CM

## 2017-07-22 ENCOUNTER — Ambulatory Visit (INDEPENDENT_AMBULATORY_CARE_PROVIDER_SITE_OTHER): Payer: BLUE CROSS/BLUE SHIELD | Admitting: *Deleted

## 2017-07-22 DIAGNOSIS — J309 Allergic rhinitis, unspecified: Secondary | ICD-10-CM

## 2017-08-12 ENCOUNTER — Ambulatory Visit (INDEPENDENT_AMBULATORY_CARE_PROVIDER_SITE_OTHER): Payer: BLUE CROSS/BLUE SHIELD | Admitting: *Deleted

## 2017-08-12 DIAGNOSIS — J309 Allergic rhinitis, unspecified: Secondary | ICD-10-CM | POA: Diagnosis not present

## 2017-08-21 DIAGNOSIS — L237 Allergic contact dermatitis due to plants, except food: Secondary | ICD-10-CM | POA: Diagnosis not present

## 2017-09-01 ENCOUNTER — Ambulatory Visit (INDEPENDENT_AMBULATORY_CARE_PROVIDER_SITE_OTHER): Payer: BLUE CROSS/BLUE SHIELD

## 2017-09-01 DIAGNOSIS — J309 Allergic rhinitis, unspecified: Secondary | ICD-10-CM | POA: Diagnosis not present

## 2017-09-23 ENCOUNTER — Ambulatory Visit (INDEPENDENT_AMBULATORY_CARE_PROVIDER_SITE_OTHER): Payer: BLUE CROSS/BLUE SHIELD | Admitting: *Deleted

## 2017-09-23 DIAGNOSIS — J309 Allergic rhinitis, unspecified: Secondary | ICD-10-CM

## 2017-10-14 ENCOUNTER — Ambulatory Visit (INDEPENDENT_AMBULATORY_CARE_PROVIDER_SITE_OTHER): Payer: BLUE CROSS/BLUE SHIELD | Admitting: *Deleted

## 2017-10-14 DIAGNOSIS — J309 Allergic rhinitis, unspecified: Secondary | ICD-10-CM | POA: Diagnosis not present

## 2017-10-16 NOTE — Progress Notes (Signed)
VIALS EXP 10-17-18

## 2017-10-17 DIAGNOSIS — J301 Allergic rhinitis due to pollen: Secondary | ICD-10-CM | POA: Diagnosis not present

## 2017-11-04 ENCOUNTER — Ambulatory Visit (INDEPENDENT_AMBULATORY_CARE_PROVIDER_SITE_OTHER): Payer: BLUE CROSS/BLUE SHIELD | Admitting: *Deleted

## 2017-11-04 DIAGNOSIS — J309 Allergic rhinitis, unspecified: Secondary | ICD-10-CM

## 2017-11-25 ENCOUNTER — Ambulatory Visit (INDEPENDENT_AMBULATORY_CARE_PROVIDER_SITE_OTHER): Payer: BLUE CROSS/BLUE SHIELD | Admitting: *Deleted

## 2017-11-25 DIAGNOSIS — J309 Allergic rhinitis, unspecified: Secondary | ICD-10-CM | POA: Diagnosis not present

## 2017-11-25 DIAGNOSIS — R42 Dizziness and giddiness: Secondary | ICD-10-CM | POA: Diagnosis not present

## 2017-11-25 DIAGNOSIS — H903 Sensorineural hearing loss, bilateral: Secondary | ICD-10-CM | POA: Diagnosis not present

## 2017-12-09 ENCOUNTER — Ambulatory Visit (INDEPENDENT_AMBULATORY_CARE_PROVIDER_SITE_OTHER): Payer: BLUE CROSS/BLUE SHIELD | Admitting: *Deleted

## 2017-12-09 DIAGNOSIS — J309 Allergic rhinitis, unspecified: Secondary | ICD-10-CM

## 2017-12-24 ENCOUNTER — Ambulatory Visit (INDEPENDENT_AMBULATORY_CARE_PROVIDER_SITE_OTHER): Payer: BLUE CROSS/BLUE SHIELD | Admitting: *Deleted

## 2017-12-24 DIAGNOSIS — J309 Allergic rhinitis, unspecified: Secondary | ICD-10-CM

## 2017-12-26 DIAGNOSIS — R42 Dizziness and giddiness: Secondary | ICD-10-CM | POA: Diagnosis not present

## 2017-12-30 ENCOUNTER — Ambulatory Visit (INDEPENDENT_AMBULATORY_CARE_PROVIDER_SITE_OTHER): Payer: BLUE CROSS/BLUE SHIELD | Admitting: *Deleted

## 2017-12-30 DIAGNOSIS — H903 Sensorineural hearing loss, bilateral: Secondary | ICD-10-CM | POA: Diagnosis not present

## 2017-12-30 DIAGNOSIS — J309 Allergic rhinitis, unspecified: Secondary | ICD-10-CM

## 2017-12-30 DIAGNOSIS — R42 Dizziness and giddiness: Secondary | ICD-10-CM | POA: Diagnosis not present

## 2018-01-06 ENCOUNTER — Ambulatory Visit (INDEPENDENT_AMBULATORY_CARE_PROVIDER_SITE_OTHER): Payer: BLUE CROSS/BLUE SHIELD | Admitting: *Deleted

## 2018-01-06 DIAGNOSIS — J309 Allergic rhinitis, unspecified: Secondary | ICD-10-CM | POA: Diagnosis not present

## 2018-01-27 ENCOUNTER — Ambulatory Visit (INDEPENDENT_AMBULATORY_CARE_PROVIDER_SITE_OTHER): Payer: BLUE CROSS/BLUE SHIELD | Admitting: *Deleted

## 2018-01-27 DIAGNOSIS — J309 Allergic rhinitis, unspecified: Secondary | ICD-10-CM

## 2018-02-17 ENCOUNTER — Ambulatory Visit (INDEPENDENT_AMBULATORY_CARE_PROVIDER_SITE_OTHER): Payer: BLUE CROSS/BLUE SHIELD | Admitting: *Deleted

## 2018-02-17 DIAGNOSIS — J309 Allergic rhinitis, unspecified: Secondary | ICD-10-CM

## 2018-03-10 ENCOUNTER — Ambulatory Visit (INDEPENDENT_AMBULATORY_CARE_PROVIDER_SITE_OTHER): Payer: BLUE CROSS/BLUE SHIELD | Admitting: *Deleted

## 2018-03-10 DIAGNOSIS — J309 Allergic rhinitis, unspecified: Secondary | ICD-10-CM | POA: Diagnosis not present

## 2018-03-31 ENCOUNTER — Ambulatory Visit (INDEPENDENT_AMBULATORY_CARE_PROVIDER_SITE_OTHER): Payer: BLUE CROSS/BLUE SHIELD | Admitting: *Deleted

## 2018-03-31 DIAGNOSIS — J309 Allergic rhinitis, unspecified: Secondary | ICD-10-CM

## 2018-04-08 ENCOUNTER — Encounter: Payer: Self-pay | Admitting: *Deleted

## 2018-04-08 NOTE — Progress Notes (Signed)
Maintenance vial made. Exp: 04-09-19. hv 

## 2018-04-10 DIAGNOSIS — J3089 Other allergic rhinitis: Secondary | ICD-10-CM | POA: Diagnosis not present

## 2018-04-21 ENCOUNTER — Ambulatory Visit (INDEPENDENT_AMBULATORY_CARE_PROVIDER_SITE_OTHER): Payer: BLUE CROSS/BLUE SHIELD | Admitting: *Deleted

## 2018-04-21 DIAGNOSIS — J309 Allergic rhinitis, unspecified: Secondary | ICD-10-CM

## 2018-04-30 DIAGNOSIS — Z23 Encounter for immunization: Secondary | ICD-10-CM | POA: Diagnosis not present

## 2018-05-13 ENCOUNTER — Ambulatory Visit (INDEPENDENT_AMBULATORY_CARE_PROVIDER_SITE_OTHER): Payer: BLUE CROSS/BLUE SHIELD | Admitting: *Deleted

## 2018-05-13 DIAGNOSIS — J309 Allergic rhinitis, unspecified: Secondary | ICD-10-CM | POA: Diagnosis not present

## 2018-05-26 ENCOUNTER — Ambulatory Visit (INDEPENDENT_AMBULATORY_CARE_PROVIDER_SITE_OTHER): Payer: BLUE CROSS/BLUE SHIELD | Admitting: *Deleted

## 2018-05-26 DIAGNOSIS — J309 Allergic rhinitis, unspecified: Secondary | ICD-10-CM | POA: Diagnosis not present

## 2018-06-02 ENCOUNTER — Ambulatory Visit (INDEPENDENT_AMBULATORY_CARE_PROVIDER_SITE_OTHER): Payer: BLUE CROSS/BLUE SHIELD | Admitting: *Deleted

## 2018-06-02 DIAGNOSIS — J309 Allergic rhinitis, unspecified: Secondary | ICD-10-CM

## 2018-06-05 DIAGNOSIS — T733XXA Exhaustion due to excessive exertion, initial encounter: Secondary | ICD-10-CM | POA: Diagnosis not present

## 2018-06-05 DIAGNOSIS — C61 Malignant neoplasm of prostate: Secondary | ICD-10-CM | POA: Diagnosis not present

## 2018-06-05 DIAGNOSIS — W57XXXA Bitten or stung by nonvenomous insect and other nonvenomous arthropods, initial encounter: Secondary | ICD-10-CM | POA: Diagnosis not present

## 2018-06-05 DIAGNOSIS — E559 Vitamin D deficiency, unspecified: Secondary | ICD-10-CM | POA: Diagnosis not present

## 2018-06-09 ENCOUNTER — Ambulatory Visit (INDEPENDENT_AMBULATORY_CARE_PROVIDER_SITE_OTHER): Payer: BLUE CROSS/BLUE SHIELD | Admitting: *Deleted

## 2018-06-09 DIAGNOSIS — J309 Allergic rhinitis, unspecified: Secondary | ICD-10-CM | POA: Diagnosis not present

## 2018-06-16 ENCOUNTER — Ambulatory Visit (INDEPENDENT_AMBULATORY_CARE_PROVIDER_SITE_OTHER): Payer: BLUE CROSS/BLUE SHIELD | Admitting: *Deleted

## 2018-06-16 DIAGNOSIS — J309 Allergic rhinitis, unspecified: Secondary | ICD-10-CM

## 2018-06-23 ENCOUNTER — Ambulatory Visit (INDEPENDENT_AMBULATORY_CARE_PROVIDER_SITE_OTHER): Payer: BLUE CROSS/BLUE SHIELD | Admitting: *Deleted

## 2018-06-23 DIAGNOSIS — J309 Allergic rhinitis, unspecified: Secondary | ICD-10-CM

## 2018-06-24 ENCOUNTER — Telehealth: Payer: Self-pay

## 2018-06-24 NOTE — Telephone Encounter (Signed)
SENT REFERRAL TO SCHEDULING AND FILED NOTES 

## 2018-07-03 DIAGNOSIS — R5383 Other fatigue: Secondary | ICD-10-CM | POA: Diagnosis not present

## 2018-07-03 DIAGNOSIS — E785 Hyperlipidemia, unspecified: Secondary | ICD-10-CM | POA: Diagnosis not present

## 2018-07-03 DIAGNOSIS — Z0000 Encounter for general adult medical examination without abnormal findings: Secondary | ICD-10-CM | POA: Diagnosis not present

## 2018-07-09 ENCOUNTER — Encounter: Payer: Self-pay | Admitting: Cardiology

## 2018-07-09 ENCOUNTER — Ambulatory Visit: Payer: BLUE CROSS/BLUE SHIELD | Admitting: Cardiology

## 2018-07-09 VITALS — BP 102/70 | HR 58 | Ht 70.0 in | Wt 170.6 lb

## 2018-07-09 DIAGNOSIS — Z01812 Encounter for preprocedural laboratory examination: Secondary | ICD-10-CM

## 2018-07-09 DIAGNOSIS — R9439 Abnormal result of other cardiovascular function study: Secondary | ICD-10-CM | POA: Diagnosis not present

## 2018-07-09 DIAGNOSIS — Z8249 Family history of ischemic heart disease and other diseases of the circulatory system: Secondary | ICD-10-CM

## 2018-07-09 MED ORDER — METOPROLOL TARTRATE 25 MG PO TABS: 25.0000 mg | ORAL_TABLET | ORAL | 0 refills | Status: DC

## 2018-07-09 NOTE — Patient Instructions (Signed)
Medication Instructions:  The current medical regimen is effective;  continue present plan and medications.  Labwork: Please have blood work prior to your CT scan.  Testing/Procedures: You have been ordered to have a Coronary CT scan.  Follow-Up: Follow up in 1 year with Dr. Marlou Porch.  You will receive a letter in the mail 2 months before you are due.  Please call us when you receive this letter to schedule your follow up appointment.  If you need a refill on your cardiac medications before your next appointment, please call your pharmacy.  Thank you for choosing [facility]!!    Please arrive at the [facility] main entrance of [facility] at xx:xx AM (30-45 minutes prior to test start time)  [facility] [place], [place] [address] [phone]  Proceed to the [facility] Radiology Department (First Floor).  Please follow these instructions carefully (unless otherwise directed):  Hold all erectile dysfunction medications at least 48 hours prior to test.  On the Night Before the Test: . Drink plenty of water. . Do not consume any caffeinated/decaffeinated beverages or chocolate 12 hours prior to your test. . Do not take any antihistamines 12 hours prior to your test. . If you take Metformin do not take 24 hours prior to test. . If the patient has contrast allergy: ? Patient will need a prescription for Prednisone and very clear instructions (as follows): 1. Prednisone 50 mg - take 13 hours prior to test 2. Take another Prednisone 50 mg 7 hours prior to test 3. Take another Prednisone 50 mg 1 hour prior to test 4. Take Benadryl 50 mg 1 hour prior to test . Patient must complete all four doses of above prophylactic medications. . Patient will need a ride after test due to Benadryl.  On the Day of the Test: . Drink plenty of water. Do not drink any water within one hour of the test. . Do not eat any food 4 hours prior to the  test. . You may take your regular medications prior to the test. . IF NOT ON A BETA BLOCKER - Take 25 mg of lopressor (metoprolol) one hour before the test. . HOLD Furosemide morning of the test.  After the Test: . Drink plenty of water. . After receiving IV contrast, you may experience a mild flushed feeling. This is normal. . On occasion, you may experience a mild rash up to 24 hours after the test. This is not dangerous. If this occurs, you can take Benadryl 25 mg and increase your fluid intake. . If you experience trouble breathing, this can be serious. If it is severe call 911 IMMEDIATELY. If it is mild, please call our office. . If you take any of these medications: Glipizide/Metformin, Avandament, Glucavance, please do not take 48 hours after completing test.

## 2018-07-09 NOTE — Progress Notes (Signed)
[facility]. [address], [address] [place], [place]  [address] Phone: [phone] Fax:  [phone]  Date:  07/09/2018   ID:  Rick Love, DOB Oct 15, 1951, MRN [id number]  PCP:  Orpah Melter, MD   History of Present Illness: Rick Love is a 67 y.o. male familial hyperlipidemia, LDL 56 untreated, family history of CAD with brother having MI at age 23 here to establish care.  Is been over 4 years since his last visit.  Nuclear stress test 06/06/11 and 12/18/2014-low risk but no ischemia. ST segments were noted during stress test which overall was false positive.  Crestor. Compliant. Doing well. Familial hyperlipidemia.  He has aspirations to climb [place]. [place]. Wife is in [place] currently.  During exercise he has been feeling more fatigued, mild shortness of breath. He climbed [place] and his heart rate was in zone 4 for quite an extended period of time.it has been taking more time for his heart rate to decrease.   07/09/2018-he once again is been complaining of some excessive fatigue and shortness of breath with activity. Had a set back with fatigue. Looks pale  Blue lips.    Wt Readings from Last 3 Encounters:  07/09/18 170 lb 9.6 oz (77.4 kg)  12/15/14 158 lb (71.7 kg)  10/28/14 165 lb (74.8 kg)     Past Medical History:  Diagnosis Date  . Asthma   . Familial hyperlipidemia 10/21/2013   225 untreated LDL  . Family history of ischemic heart disease 10/21/2013  . Hearing loss in right ear 10/21/2013   Transient  . Histoplasmosis   . Meralgia paresthetica   . Prostate cancer ([place]) 01/08/2010  . Sinus bradycardia 10/21/2013   Asymptomatic    Past Surgical History:  Procedure Laterality Date  . no surgical hx      Current Outpatient Medications  Medication Sig Dispense Refill  . albuterol (PROVENTIL HFA) 108 (90 BASE) MCG/ACT inhaler INHALE TWO PUFFS EVERY FOUR TO SIX HOURS AS NEEDED FOR COUGH OR WHEEZE. 1 Inhaler 1  . cetirizine (ZYRTEC) 5 MG tablet Take 5  mg by mouth daily.    . cholecalciferol (VITAMIN D) 1000 UNITS tablet Take by mouth 2 (two) times daily.    Marland Kitchen EPINEPHrine (EPIPEN 2-PAK) 0.3 mg/0.3 mL IJ SOAJ injection Inject 0.3 mg into the muscle once.    Marland Kitchen ipratropium (ATROVENT) 0.06 % nasal spray Place 2 sprays into both nostrils every 6 (six) hours as needed for rhinitis. 15 mL 5  . loratadine (CLARITIN) 10 MG tablet Take 10 mg by mouth daily.    . mometasone (NASONEX) 50 MCG/ACT nasal spray Use 1-2 sprays in each nostril once daily 51 g 3  . ranitidine (ZANTAC) 300 MG tablet Take 300 mg by mouth at bedtime.    . rosuvastatin (CRESTOR) 20 MG tablet Take 20 mg by mouth daily.    . metoprolol tartrate (LOPRESSOR) 25 MG tablet Take 1 tablet (25 mg total) by mouth as directed. Take 1 tablet 1 hour prior to your CT scan 1 tablet 0   No current facility-administered medications for this visit.     Allergies:    Allergies  Allergen Reactions  . Tape Rash    Social History:  The patient  reports that he has never smoked. He has never used smokeless tobacco.   ROS:  Please see the history of present illness.  PHYSICAL EXAM: VS:  BP 102/70   Pulse (!) 58   Ht 5\' 10"  (1.778 m)  Wt 170 lb 9.6 oz (77.4 kg)   SpO2 97%   BMI 24.48 kg/m  GEN: Well nourished, well developed, in no acute distress (does appear slightly pale) HEENT: normal  Neck: no JVD, carotid bruits, or masses Cardiac: RRR; no murmurs, rubs, or gallops,no edema  Respiratory:  clear to auscultation bilaterally, normal work of breathing GI: soft, nontender, nondistended, + BS MS: no deformity or atrophy  Skin: warm and dry, no rash Neuro:  Alert and Oriented x 3, Strength and sensation are intact Psych: euthymic mood, full affect   EKG: 07/09/2018-sinus bradycardia 58 with no other abnormalities.  Personally viewed.  10/28/14-sinus bradycardia rate 58 with nonspecific ST-T wave changes, previouslySinus bradycardia rate 51 with nonspecific T-wave changes, no  changes.  Labs: 10/21/13-LDL 85. CBC and lipids look normal.  ASSESSMENT AND PLAN:  Abnormal exercise treadmill test/dyspnea on exertion which may be an anginal equivalent. - He has had ST segment depression in the past.  Abnormal.  Could be ischemia.  Because of this, I would like to pursue coronary CT scan with possible FFR analysis to ensure that he does not have any flow-limiting coronary artery disease.  Pale lips are noted at times.  Question hypoperfusion.  Normal oxygen saturation noted.  Given his mild baseline bradycardia at 58, we will use very low-dose metoprolol 25 mg once a day prior to CT scan.  Strong family history of coronary artery disease - Continue with primary prevention fitness exercise.  Increased fatigue - Making sure that this is not a manifestation of coronary disease.  No signs of anemia, hemoglobin 14.  Normal renal function.  Hyperlipidemia, likely familial -Continue with Crestor.  LDL has increased slightly from 85-1 01 at last check.  Right-sided hearing loss -Possible viral neuritis.  We will follow-up with results of study.  Otherwise, 2-year follow-up for prevention efforts.  Signed, Candee Furbish, MD [facility]  07/09/2018 4:59 PM

## 2018-07-21 ENCOUNTER — Ambulatory Visit (INDEPENDENT_AMBULATORY_CARE_PROVIDER_SITE_OTHER): Payer: BLUE CROSS/BLUE SHIELD | Admitting: *Deleted

## 2018-07-21 DIAGNOSIS — J309 Allergic rhinitis, unspecified: Secondary | ICD-10-CM | POA: Diagnosis not present

## 2018-07-29 ENCOUNTER — Other Ambulatory Visit: Payer: BLUE CROSS/BLUE SHIELD | Admitting: *Deleted

## 2018-07-29 DIAGNOSIS — Z01812 Encounter for preprocedural laboratory examination: Secondary | ICD-10-CM | POA: Diagnosis not present

## 2018-07-29 DIAGNOSIS — R9439 Abnormal result of other cardiovascular function study: Secondary | ICD-10-CM

## 2018-07-29 DIAGNOSIS — Z8249 Family history of ischemic heart disease and other diseases of the circulatory system: Secondary | ICD-10-CM | POA: Diagnosis not present

## 2018-07-30 LAB — BASIC METABOLIC PANEL
BUN/Creatinine Ratio: 20 (ref 10–24)
BUN: 18 mg/dL (ref 8–27)
CALCIUM: 9.9 mg/dL (ref 8.6–10.2)
CHLORIDE: 106 mmol/L (ref 96–106)
CO2: 27 mmol/L (ref 20–29)
Creatinine, Ser: 0.92 mg/dL (ref 0.76–1.27)
GFR calc Af Amer: 101 mL/min/{1.73_m2} (ref >59)
GFR calc non Af Amer: 88 mL/min/{1.73_m2} (ref >59)
Glucose: 72 mg/dL (ref 65–99)
POTASSIUM: 4.1 mmol/L (ref 3.5–5.2)
Sodium: 147 mmol/L — ABNORMAL HIGH (ref 134–144)

## 2018-07-31 ENCOUNTER — Encounter (HOSPITAL_COMMUNITY): Payer: Self-pay

## 2018-07-31 ENCOUNTER — Ambulatory Visit (HOSPITAL_COMMUNITY): Payer: BLUE CROSS/BLUE SHIELD

## 2018-07-31 ENCOUNTER — Ambulatory Visit (HOSPITAL_COMMUNITY)
Admission: RE | Admit: 2018-07-31 | Discharge: 2018-07-31 | Disposition: A | Discharge status: Home / Self Care | Payer: BLUE CROSS/BLUE SHIELD | Source: Ambulatory Visit | Attending: Cardiology | Admitting: Cardiology

## 2018-07-31 DIAGNOSIS — R9439 Abnormal result of other cardiovascular function study: Secondary | ICD-10-CM | POA: Diagnosis not present

## 2018-07-31 DIAGNOSIS — Z8249 Family history of ischemic heart disease and other diseases of the circulatory system: Secondary | ICD-10-CM | POA: Diagnosis not present

## 2018-07-31 DIAGNOSIS — I2510 Atherosclerotic heart disease of native coronary artery without angina pectoris: Secondary | ICD-10-CM | POA: Diagnosis not present

## 2018-07-31 DIAGNOSIS — I700 Atherosclerosis of aorta: Secondary | ICD-10-CM | POA: Diagnosis not present

## 2018-07-31 DIAGNOSIS — K8020 Calculus of gallbladder without cholecystitis without obstruction: Secondary | ICD-10-CM | POA: Diagnosis not present

## 2018-07-31 DIAGNOSIS — R079 Chest pain, unspecified: Secondary | ICD-10-CM | POA: Diagnosis not present

## 2018-07-31 MED ORDER — IOPAMIDOL (ISOVUE-370) INJECTION 76%
100.0000 mL | Freq: Once | INTRAVENOUS | Status: AC | PRN
  Administered 2018-07-31: 100 mL via INTRAVENOUS

## 2018-07-31 MED ORDER — NITROGLYCERIN 0.4 MG SL SUBL
0.8000 mg | SUBLINGUAL_TABLET | Freq: Once | SUBLINGUAL | Status: DC
  Filled 2018-07-31: qty 25

## 2018-07-31 MED ORDER — NITROGLYCERIN 0.4 MG SL SUBL
SUBLINGUAL_TABLET | SUBLINGUAL | Status: AC
  Administered 2018-07-31: 0.8 mg
  Filled 2018-07-31: qty 2

## 2018-08-17 ENCOUNTER — Ambulatory Visit (INDEPENDENT_AMBULATORY_CARE_PROVIDER_SITE_OTHER): Payer: BLUE CROSS/BLUE SHIELD | Admitting: *Deleted

## 2018-08-17 DIAGNOSIS — J309 Allergic rhinitis, unspecified: Secondary | ICD-10-CM | POA: Diagnosis not present

## 2018-09-15 ENCOUNTER — Ambulatory Visit (INDEPENDENT_AMBULATORY_CARE_PROVIDER_SITE_OTHER): Payer: BLUE CROSS/BLUE SHIELD | Admitting: *Deleted

## 2018-09-15 DIAGNOSIS — J309 Allergic rhinitis, unspecified: Secondary | ICD-10-CM | POA: Diagnosis not present

## 2018-10-16 ENCOUNTER — Ambulatory Visit (INDEPENDENT_AMBULATORY_CARE_PROVIDER_SITE_OTHER): Payer: BLUE CROSS/BLUE SHIELD

## 2018-10-16 DIAGNOSIS — J309 Allergic rhinitis, unspecified: Secondary | ICD-10-CM | POA: Diagnosis not present

## 2018-10-20 DIAGNOSIS — J4520 Mild intermittent asthma, uncomplicated: Secondary | ICD-10-CM | POA: Diagnosis not present

## 2018-10-20 DIAGNOSIS — J3089 Other allergic rhinitis: Secondary | ICD-10-CM | POA: Diagnosis not present

## 2018-10-20 NOTE — Progress Notes (Signed)
Vials exp 10-21-19

## 2018-10-26 DIAGNOSIS — J3089 Other allergic rhinitis: Secondary | ICD-10-CM

## 2018-11-10 ENCOUNTER — Ambulatory Visit (INDEPENDENT_AMBULATORY_CARE_PROVIDER_SITE_OTHER): Payer: Self-pay | Admitting: *Deleted

## 2018-11-10 DIAGNOSIS — J309 Allergic rhinitis, unspecified: Secondary | ICD-10-CM

## 2018-12-08 ENCOUNTER — Ambulatory Visit (INDEPENDENT_AMBULATORY_CARE_PROVIDER_SITE_OTHER): Payer: Medicare Other | Admitting: *Deleted

## 2018-12-08 DIAGNOSIS — J309 Allergic rhinitis, unspecified: Secondary | ICD-10-CM | POA: Diagnosis not present

## 2018-12-22 ENCOUNTER — Ambulatory Visit (INDEPENDENT_AMBULATORY_CARE_PROVIDER_SITE_OTHER): Payer: Medicare Other | Admitting: *Deleted

## 2018-12-22 DIAGNOSIS — J309 Allergic rhinitis, unspecified: Secondary | ICD-10-CM | POA: Diagnosis not present

## 2018-12-29 ENCOUNTER — Ambulatory Visit (INDEPENDENT_AMBULATORY_CARE_PROVIDER_SITE_OTHER): Payer: Medicare Other | Admitting: *Deleted

## 2018-12-29 DIAGNOSIS — J309 Allergic rhinitis, unspecified: Secondary | ICD-10-CM

## 2019-01-05 ENCOUNTER — Ambulatory Visit (INDEPENDENT_AMBULATORY_CARE_PROVIDER_SITE_OTHER): Payer: Medicare Other | Admitting: *Deleted

## 2019-01-05 DIAGNOSIS — J309 Allergic rhinitis, unspecified: Secondary | ICD-10-CM | POA: Diagnosis not present

## 2019-01-13 ENCOUNTER — Ambulatory Visit (INDEPENDENT_AMBULATORY_CARE_PROVIDER_SITE_OTHER): Payer: Medicare Other

## 2019-01-13 DIAGNOSIS — J309 Allergic rhinitis, unspecified: Secondary | ICD-10-CM

## 2019-01-19 ENCOUNTER — Ambulatory Visit (INDEPENDENT_AMBULATORY_CARE_PROVIDER_SITE_OTHER): Payer: Medicare Other | Admitting: *Deleted

## 2019-01-19 DIAGNOSIS — J309 Allergic rhinitis, unspecified: Secondary | ICD-10-CM | POA: Diagnosis not present

## 2019-02-16 ENCOUNTER — Ambulatory Visit (INDEPENDENT_AMBULATORY_CARE_PROVIDER_SITE_OTHER): Payer: Medicare Other | Admitting: *Deleted

## 2019-02-16 DIAGNOSIS — J309 Allergic rhinitis, unspecified: Secondary | ICD-10-CM

## 2020-01-06 IMAGING — CT CT HEART MORP W/ CTA COR W/ SCORE W/ CA W/CM &/OR W/O CM
4 of 7 series · 8 of 20 positions shown, 9 images · IV contrast (APPLIED)
Comparison: None.

CLINICAL DATA: Chest pain

EXAM:
Cardiac CTA
MEDICATIONS:
Sub lingual nitro. 4mg x 2
TECHNIQUE: The patient was scanned on a Siemens [REDACTED]ice scanner. Gantry
rotation speed was 250 msecs. Collimation was 0.8 mm. A 100 kV
prospective scan was triggered in the ascending thoracic aorta at
35-75% of the R-R interval. Average HR during the scan was 60 bpm.
The 3D data set was interpreted on a dedicated work station using
MPR, MIP and VRT modes. A total of 80cc of contrast was used.

[Series 6: best diast 65 % · axial · 0.39mm/px · z∈[+1103,+1162]mm · 2 of 448 slices shown]
[im 150/448  vessel]
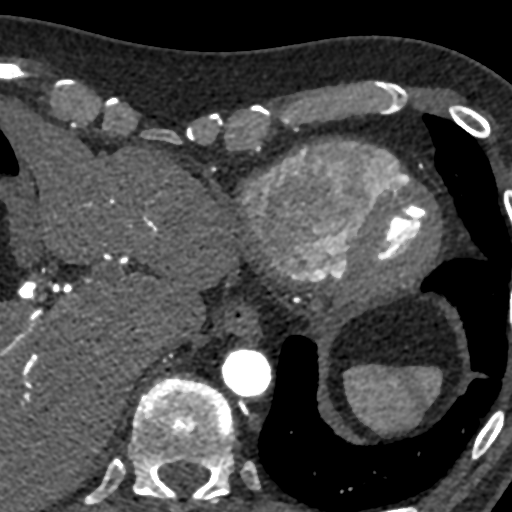
[im 299/448  vessel]
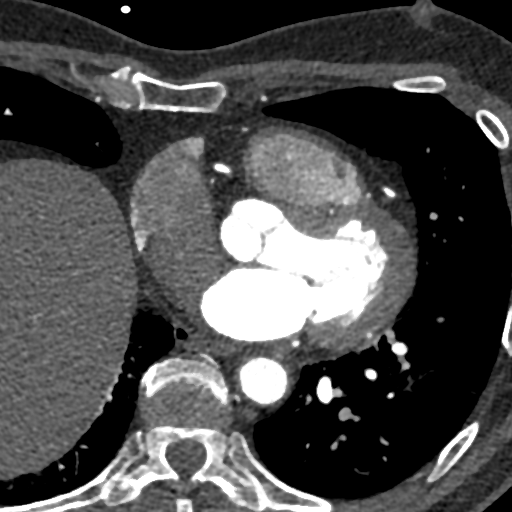

[Series 7: best syst 49 % · axial · 0.39mm/px · z∈[+1103,+1162]mm · 2 of 448 slices shown]
[im 150/448  vessel]
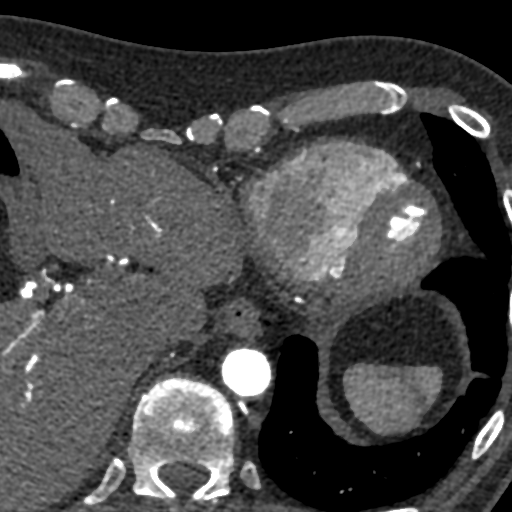
[im 299/448  vessel]
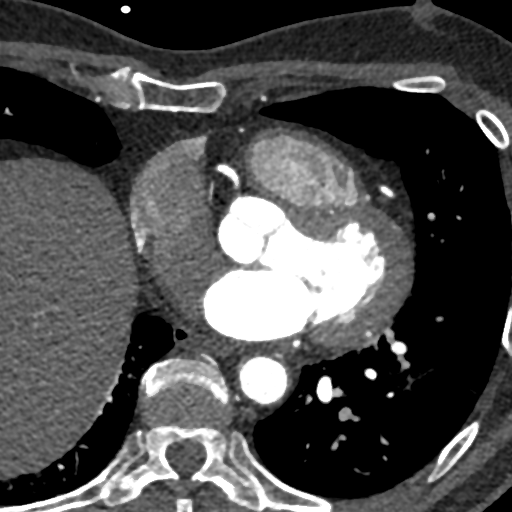

[Series 8: ts diast sharp 65 % · axial · 0.39mm/px · z∈[+1103,+1162]mm · 2 of 448 slices shown]
[im 150/448  lung]
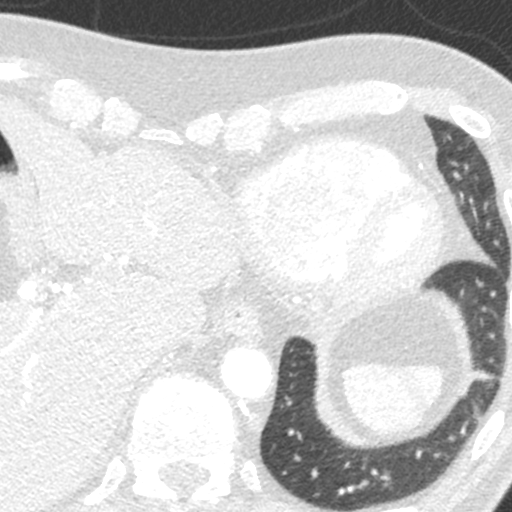
[im 299/448  lung]
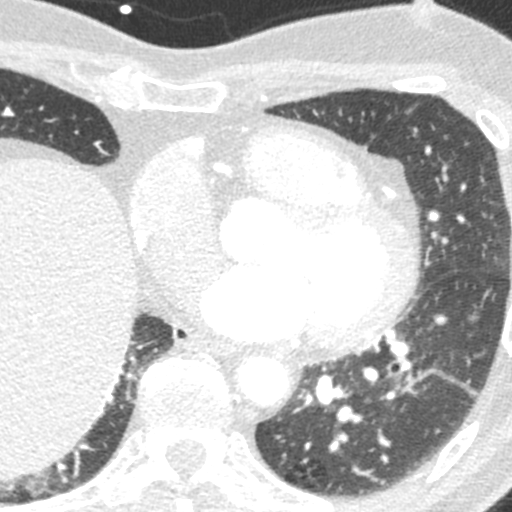

[Series 9: ts syst sharp · axial · 0.39mm/px · z∈[+1103,+1162]mm · 2 of 448 slices shown, 3 images]
[im 150/448  vessel]
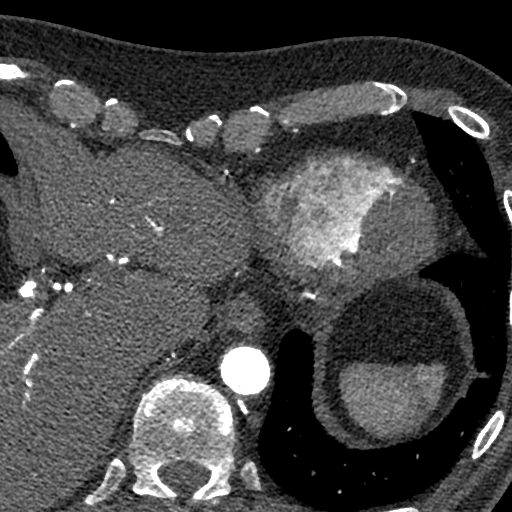
[im 150/448  lung]
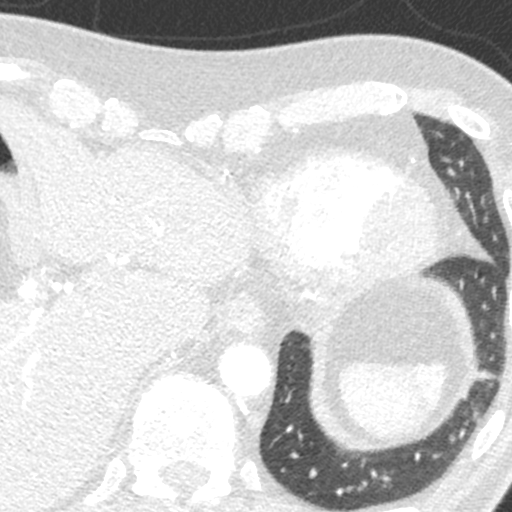
[im 299/448  vessel]
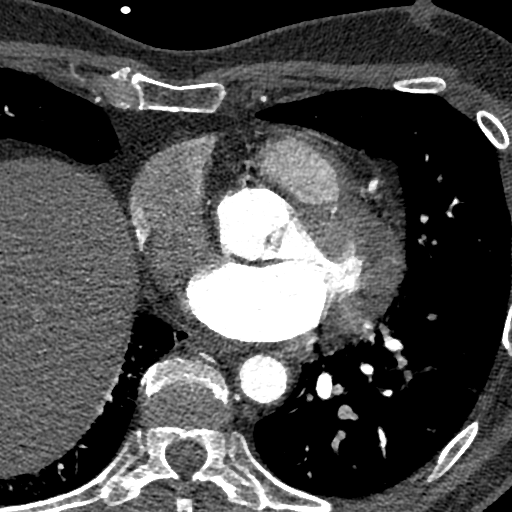

[8 of 20 positions shown; findings below may reference images not displayed]

FINDINGS: Non-cardiac: See separate report from [REDACTED].

Calcium Score: 128 Agatston units

Coronary Arteries: Right dominant with no anomalies

LM: No plaque or stenosis.

LAD system: Calcified plaque in the ostial/proximal LAD, mild
stenosis.

Circumflex system: Large PLOM. No plaque or stenosis in the LCx
system.

RCA system: Mixed plaque in the proximal RCA, no significant
stenosis.
IMPRESSION: 1. Coronary artery calcium score 128 Agatston units. This places the
patient in the 62nd percentile for age and gender, suggesting
intermediate risk of future cardiac events.

2.  Mild ostial/proximal LAD stenosis with calcified plaque.

Blade Aujla

EXAM:
OVER-READ INTERPRETATION  CT CHEST

The following report is an over-read performed by radiologist Dr.
Lai Milla [REDACTED] on 07/31/2018. This
over-read does not include interpretation of cardiac or coronary
anatomy or pathology. The coronary calcium score/coronary CTA
interpretation by the cardiologist is attached.
FINDINGS: Aortic atherosclerosis. Numerous densely calcified mediastinal and
bilateral hilar lymph nodes are incidentally noted. There also
multiple densely calcified granulomas in the lungs bilaterally,
indicative of old granulomatous disease. Within the visualized
portions of the thorax there are no suspicious appearing pulmonary
nodules or masses, there is no acute consolidative airspace disease,
no pleural effusions, no pneumothorax and no lymphadenopathy.
Visualized portions of the abdomen demonstrate numerous densely
calcified gallstones in the neck of the gallbladder. Small calcified
granulomas are also noted in the liver and spleen. There are no
aggressive appearing lytic or blastic lesions noted in the
visualized portions of the skeleton.
IMPRESSION: 1. Aortic atherosclerosis.
2. Old granulomatous disease, as above.
3. Cholelithiasis.

## 2020-01-31 ENCOUNTER — Ambulatory Visit: Payer: Medicare Other | Attending: Internal Medicine

## 2020-01-31 DIAGNOSIS — Z23 Encounter for immunization: Secondary | ICD-10-CM | POA: Insufficient documentation

## 2020-01-31 NOTE — Progress Notes (Signed)
   Covid-19 Vaccination Clinic  Name:  Rick Love    MRN: [id number] DOB: 12/17/1951  01/31/2020  Mr. Rick Love was observed post Covid-19 immunization for 15 minutes without incidence. He was provided with Vaccine Information Sheet and instruction to access the V-Safe system.   Mr. Rick Love was instructed to call 911 with any severe reactions post vaccine: Marland Kitchen Difficulty breathing  . Swelling of your face and throat  . A fast heartbeat  . A bad rash all over your body  . Dizziness and weakness    Immunizations Administered    Name Date Dose VIS Date Route   Pfizer COVID-19 Vaccine 01/31/2020  4:03 PM 0.3 mL 12/03/2019 Intramuscular   Manufacturer: [place]   Lot: [id number]   [place]: [id number]

## 2020-02-25 ENCOUNTER — Ambulatory Visit: Payer: Medicare Other | Attending: Internal Medicine

## 2020-02-25 DIAGNOSIS — Z23 Encounter for immunization: Secondary | ICD-10-CM | POA: Insufficient documentation

## 2020-02-25 NOTE — Progress Notes (Signed)
   Covid-19 Vaccination Clinic  Name:  Dakarie Decardenas    MRN: [id number] DOB: 26-Oct-1951  02/25/2020  Mr. Crombie was observed post Covid-19 immunization for 15 minutes without incident. He was provided with Vaccine Information Sheet and instruction to access the V-Safe system.   Mr. Zemaitis was instructed to call 911 with any severe reactions post vaccine: Marland Kitchen Difficulty breathing  . Swelling of face and throat  . A fast heartbeat  . A bad rash all over body  . Dizziness and weakness   Immunizations Administered    Name Date Dose VIS Date Route   Pfizer COVID-19 Vaccine 02/25/2020 11:53 AM 0.3 mL 12/03/2019 Intramuscular   Manufacturer: [place]   Lot: [id number]   [place]: [id number]

## 2021-03-11 ENCOUNTER — Encounter (INDEPENDENT_AMBULATORY_CARE_PROVIDER_SITE_OTHER): Payer: Self-pay

## 2022-03-18 NOTE — Progress Notes (Signed)
?Cardiology Office Note:   ? ?Date:  03/20/2022  ? ?ID:  Rick Love, DOB August 12, 1951, MRN [id number] ? ?PCP:  Orpah Melter, MD  ?Cardiologist:  None   ? ?Referring MD: Orpah Melter, MD  ? ?No chief complaint on file. ? ? ?History of Present Illness:   ? ?Rick Love is a 71 y.o. male with a hx of familial hyperlipidemia, bradycardia, and family history of CAD here today to establish care. He was last seen 07/09/18 where he was doing well but became fatigued with exercise.His calcium score 08/2018 was 128 which is 62nd percentile for age, race, and sex. We continued with 20 mg Crestor and working on diet and exercise.  ? ?Today, he is doing well. His Crestor was increased from 20 mg to 40 mg. However, his cholesterol is still elevated.  ? ?He received cataract surgery and the anesthesiologist told him he had a bundle branch block. This was not found on today's EKG. He denies any palpitations, chest pain, or shortness of breath, lightheadedness, headaches, syncope, orthopnea, PND, lower extremity edema or exertional symptoms. ? ?Past Medical History:  ?Diagnosis Date  ? Asthma   ? Coronary artery disease involving native coronary artery of native heart without angina pectoris 03/20/2022  ? Calcium score 08/2018 was 128 which is 62nd percentile  Mild ostial/proximal LAD stenosis with calcified plaque.  ? Familial hyperlipidemia 10/21/2013  ? 225 untreated LDL  ? Family history of ischemic heart disease 10/21/2013  ? Hearing loss in right ear 10/21/2013  ? Transient  ? Histoplasmosis   ? Meralgia paresthetica   ? Prostate cancer ([place]) 01/08/2010  ? Sinus bradycardia 10/21/2013  ? Asymptomatic  ? ? ?Past Surgical History:  ?Procedure Laterality Date  ? no surgical hx    ? ? ?Current Medications: ?Current Meds  ?Medication Sig  ? albuterol (PROVENTIL HFA) 108 (90 BASE) MCG/ACT inhaler INHALE TWO PUFFS EVERY FOUR TO SIX HOURS AS NEEDED FOR COUGH OR WHEEZE.  ? cetirizine (ZYRTEC) 5 MG tablet Take 5 mg by mouth daily.   ? Cholecalciferol (VITAMIN D) 125 MCG (5000 UT) CAPS Take 1 capsule by mouth daily.  ? EPINEPHrine 0.3 mg/0.3 mL IJ SOAJ injection Inject 0.3 mg into the muscle once.  ? ipratropium (ATROVENT) 0.06 % nasal spray Place 2 sprays into both nostrils every 6 (six) hours as needed for rhinitis.  ? loratadine (CLARITIN) 10 MG tablet Take 10 mg by mouth daily.  ? Rosuvastatin Calcium 40 MG CPSP Take 1 tablet by mouth daily.  ?  ? ?Allergies:   Tape  ? ?Social History  ? ?Socioeconomic History  ? Marital status: Married  ?  Spouse name: Not on file  ? Number of children: Not on file  ? Years of education: Not on file  ? Highest education level: Not on file  ?Occupational History  ? Not on file  ?Tobacco Use  ? Smoking status: Never  ? Smokeless tobacco: Never  ?Substance and Sexual Activity  ? Alcohol use: Not on file  ? Drug use: Not on file  ? Sexual activity: Not on file  ?Other Topics Concern  ? Not on file  ?Social History Narrative  ? Not on file  ? ?Social Determinants of Health  ? ?Financial Resource Strain: Not on file  ?Food Insecurity: Not on file  ?Transportation Needs: Not on file  ?Physical Activity: Not on file  ?Stress: Not on file  ?Social Connections: Not on file  ?  ? ?Family History: ?The patient's family  history includes Heart attack in his father; Hypertension in his mother. ? ?ROS:   ?Please see the history of present illness.    ?All other systems reviewed and negative.  ? ?EKGs/Labs/Other Studies Reviewed:   ? ?The following studies were reviewed today: ?CT Coronary Morph 08/30/18 ?IMPRESSION: ?1. Coronary artery calcium score 128 Agatston units. This places the patient in the 62nd percentile for age and gender, suggesting intermediate risk of future cardiac events. ?  ?2.  Mild ostial/proximal LAD stenosis with calcified plaque. ? ?IMPRESSION: ?1. Aortic atherosclerosis. ?2. Old granulomatous disease, as above. ?3. Cholelithiasis. ? ?Nuclear stress test 06/06/11 and 12/18/2014-low risk but no  ischemia. ST segments were noted during stress test which overall was false positive ? ?EKG:  EKG was personally reviewed ?03/20/22: Sinus bradycardia, rate 53 bpm ? ?Recent Labs: ?No results found for requested labs within last 8760 hours.  ? ?Recent Lipid Panel ?   ?Component Value Date/Time  ? CHOL 153 10/21/2013 1326  ? TRIG 60.0 10/21/2013 1326  ? HDL 55.70 10/21/2013 1326  ? CHOLHDL 3 10/21/2013 1326  ? VLDL 12.0 10/21/2013 1326  ? [place] 85 10/21/2013 1326  ? ? ?CHA2DS2-VASc Score =   '[ ]'$ .  Therefore, the patient's annual risk of stroke is   %.    ?  ? ? ?Physical Exam:   ? ?VS:  BP 112/68   Pulse (!) 53   Ht '5\' 10"'$  (1.778 m)   Wt 174 lb (78.9 kg)   SpO2 97%   BMI 24.97 kg/m?    ? ?Wt Readings from Last 3 Encounters:  ?03/20/22 174 lb (78.9 kg)  ?07/09/18 170 lb 9.6 oz (77.4 kg)  ?12/15/14 158 lb (71.7 kg)  ?  ? ?GEN:  Well nourished, well developed in no acute distress ?HEENT: Normal ?NECK: No JVD; No carotid bruits ?LYMPHATICS: No lymphadenopathy ?CARDIAC: RRR, no murmurs, rubs, gallops ?RESPIRATORY:  Clear to auscultation without rales, wheezing or rhonchi  ?ABDOMEN: Soft, non-tender, non-distended ?MUSCULOSKELETAL:  No edema; No deformity  ?SKIN: Warm and dry ?NEUROLOGIC:  Alert and oriented x 3 ?PSYCHIATRIC:  Normal affect  ? ?ASSESSMENT:   ? ?1. Familial hyperlipidemia   ?2. Coronary artery disease involving native coronary artery of native heart without angina pectoris   ?3. Sinus bradycardia   ? ?PLAN:   ? ?Familial hyperlipidemia ?Familial hyperlipidemia with untreated LDL greater than 200, 225 at 1 point.  He is on Crestor 40 mg tolerating well but his LDL is 131 HDL 50 total cholesterol 198.  Triglycerides 99.  This was performed at [facility].  I will go ahead and refer him to the pharmacy clinic for [place], PCSK9 inhibitor.  We need further protection, LDL less than 70 given his coronary artery disease. ? ?Coronary artery disease involving native coronary artery of native heart without angina  pectoris ?Calcium score 08/2018 was 128 which is 62nd percentile  ?Mild ostial/proximal LAD stenosis with calcified plaque. ?LDL is still elevated.  Shooting for a goal of less than 70.  See lipidemia.  I would like for him to start PCSK9 inhibitor.  He is not having any anginal symptoms. ? ?Sinus bradycardia ?Asymptomatic.  Anesthesiologist previously told him that he had a bundle branch block.  I do not see that on EKG today. ? ? ?  ? ? ?Medication Adjustments/Labs and Tests Ordered: ?Current medicines are reviewed at length with the patient today.  Concerns regarding medicines are outlined above.  ?Orders Placed This Encounter  ?Procedures  ? AMB  Referral to [facility]  ? EKG 12-Lead  ? ?No orders of the defined types were placed in this encounter. ? ?I,Mykaella Javier,acting as a scribe for [employer], MD.,have documented all relevant documentation on the behalf of Candee Furbish, MD,as directed by  Candee Furbish, MD while in the presence of Candee Furbish, MD. ? ?I, Candee Furbish, MD, have reviewed all documentation for this visit. The documentation on 03/20/22 for the exam, diagnosis, procedures, and orders are all accurate and complete.  ? ?Signed, ?Candee Furbish, MD  ?03/20/2022 8:53 AM    ?[place] ? ?

## 2022-03-20 ENCOUNTER — Ambulatory Visit (INDEPENDENT_AMBULATORY_CARE_PROVIDER_SITE_OTHER): Payer: Medicare Other | Admitting: Cardiology

## 2022-03-20 ENCOUNTER — Encounter: Payer: Self-pay | Admitting: Cardiology

## 2022-03-20 ENCOUNTER — Other Ambulatory Visit: Payer: Self-pay

## 2022-03-20 DIAGNOSIS — R001 Bradycardia, unspecified: Secondary | ICD-10-CM

## 2022-03-20 DIAGNOSIS — E7849 Other hyperlipidemia: Secondary | ICD-10-CM | POA: Diagnosis not present

## 2022-03-20 DIAGNOSIS — I2510 Atherosclerotic heart disease of native coronary artery without angina pectoris: Secondary | ICD-10-CM | POA: Insufficient documentation

## 2022-03-20 HISTORY — DX: Atherosclerotic heart disease of native coronary artery without angina pectoris: I25.10

## 2022-03-20 NOTE — Assessment & Plan Note (Signed)
Asymptomatic.  Anesthesiologist previously told him that he had a bundle branch block.  I do not see that on EKG today. ?

## 2022-03-20 NOTE — Patient Instructions (Signed)
Medication Instructions:  ?Your physician recommends that you continue on your current medications as directed. Please refer to the Current Medication list given to you today. ? ?*If you need a refill on your cardiac medications before your next appointment, please call your pharmacy* ? ?Lab Work: ?If you have labs (blood work) drawn today and your tests are completely normal, you will receive your results only by: ?MyChart Message (if you have MyChart) OR ?A paper copy in the mail ?If you have any lab test that is abnormal or we need to change your treatment, we will call you to review the results. ? ?Testing/Procedures: ?None ordered today. ? ?Follow-Up: ?At [facility], you and your health needs are our priority.  As part of our continuing mission to provide you with exceptional heart care, we have created designated Provider Care Teams.  These Care Teams include your primary Cardiologist (physician) and Advanced Practice Providers (APPs -  Physician Assistants and Nurse Practitioners) who all work together to provide you with the care you need, when you need it. ? ?We recommend signing up for the patient portal called "MyChart".  Sign up information is provided on this After Visit Summary.  MyChart is used to connect with patients for Virtual Visits (Telemedicine).  Patients are able to view lab/test results, encounter notes, upcoming appointments, etc.  Non-urgent messages can be sent to your provider as well.   ?To learn more about what you can do with MyChart, go to [email or web address].   ? ?Your next appointment:   ?6 month(s) ? ?The format for your next appointment:   ?In Person ? ?Provider:   ?Robbie Lis, PA-C, Nicholes Rough, PA-C, Melina Copa, PA-C, Ambrose Pancoast, NP, Cecilie Kicks, NP, Ermalinda Barrios, PA-C, Christen Bame, NP, or Richardson Dopp, PA-C      ? ? ?Other Instructions ?You have been referred to Pharmacy to discuss [place]. ? ?

## 2022-03-20 NOTE — Assessment & Plan Note (Signed)
Familial hyperlipidemia with untreated LDL greater than 200, 225 at 1 point.  He is on Crestor 40 mg tolerating well but his LDL is 131 HDL 50 total cholesterol 198.  Triglycerides 99.  This was performed at [facility].  I will go ahead and refer him to the pharmacy clinic for [place], PCSK9 inhibitor.  We need further protection, LDL less than 70 given his coronary artery disease. ?

## 2022-03-20 NOTE — Assessment & Plan Note (Signed)
Calcium score 08/2018 was 128 which is 62nd percentile  ?Mild ostial/proximal LAD stenosis with calcified plaque. ?LDL is still elevated.  Shooting for a goal of less than 70.  See lipidemia.  I would like for him to start PCSK9 inhibitor.  He is not having any anginal symptoms. ?

## 2022-04-11 NOTE — Progress Notes (Signed)
Patient ID: Rick Love                 DOB: [birth year]                    MRN: [id number] ? ? ? ? ?HPI: ?Rick Love is a 71 y.o. male patient referred to lipid clinic by Dr. Marlou Porch. PMH is significant for familial hypercholesterolemia, CAD, premature family hx of ASCVD, and bradycardia. Last seen on 07/09/18 where he was doing well. Coronary CTA in 08/2018 showed calcium score of 128 (62nd percentile for age, sex, race). Was on rosuvastatin 20 mg daily + working on diet and exercise. Saw Dr. Marlou Porch on 03/20/2022 and he referred him to lipid clinic since his LDL was still above goal at 131 on high-intensity statin. He noted pt's LDL untreated was 255 previously.  ? ?Pt presents today for lipid clinic appt. Pt previously reported muscle pain on rosuvastatin 20 mg daily so he started taking it every other day. The pain resolved when he started taking vitamin D supplements. He said his rosuvastatin was increased to 40 mg daily around 15 months ago when his LDL was elevated. He has not had any recurrent muscle pain on the higher daily dose. He does not report having bumps on his achilles tendons, elbows, or around his eyes which are indicative of FH. He reported 2 bouts of severe fatigue that lasted over several months in the past, but they resolved and PCP was not able to determine a cause.  ? ?Current Medications: rosuvastatin 40 mg once daily ? ?Risk Factors: HeFH, CAD, elevated calcium score, HLD, premature family hx of ASCVD ? ?LDL goal: <70 mg/dL ? ?Diet: Cooks all own meals. Does not eat fried food. Uses olive oil, stays away from butter. Rarely has red meat. Eats frozen meals very rarely. Snacks - dried fruit, fresh fruit. No packaged snacks ? ?Exercise: Walks 45 mins to 1 hour per day. [employer] work ? ?Family History: Heart attack (father, brother at 29), HTN (mother) ? ?Social History: Denies tobacco use ? ?Labs: ?12/25/21: TC 198, TG 99, HDL 50, LDL 131 (rosuvastatin 40 mg once daily) ? ?Past Medical History:   ?Diagnosis Date  ? Asthma   ? Coronary artery disease involving native coronary artery of native heart without angina pectoris 03/20/2022  ? Calcium score 08/2018 was 128 which is 62nd percentile  Mild ostial/proximal LAD stenosis with calcified plaque.  ? Familial hyperlipidemia 10/21/2013  ? 225 untreated LDL  ? Family history of ischemic heart disease 10/21/2013  ? Hearing loss in right ear 10/21/2013  ? Transient  ? Histoplasmosis   ? Meralgia paresthetica   ? Prostate cancer ([place]) 01/08/2010  ? Sinus bradycardia 10/21/2013  ? Asymptomatic  ? ? ?Current Outpatient Medications on File Prior to Visit  ?Medication Sig Dispense Refill  ? albuterol (PROVENTIL HFA) 108 (90 BASE) MCG/ACT inhaler INHALE TWO PUFFS EVERY FOUR TO SIX HOURS AS NEEDED FOR COUGH OR WHEEZE. 1 Inhaler 1  ? cetirizine (ZYRTEC) 5 MG tablet Take 5 mg by mouth daily.    ? Cholecalciferol (VITAMIN D) 125 MCG (5000 UT) CAPS Take 1 capsule by mouth daily.    ? EPINEPHrine 0.3 mg/0.3 mL IJ SOAJ injection Inject 0.3 mg into the muscle once.    ? ipratropium (ATROVENT) 0.06 % nasal spray Place 2 sprays into both nostrils every 6 (six) hours as needed for rhinitis. 15 mL 5  ? loratadine (CLARITIN) 10 MG tablet Take 10 mg by  mouth daily.    ? Rosuvastatin Calcium 40 MG CPSP Take 1 tablet by mouth daily.    ? ?No current facility-administered medications on file prior to visit.  ? ? ?Allergies  ?Allergen Reactions  ? Tape Rash  ? ? ?Assessment/Plan: ? ?1. Hyperlipidemia - LDL 131 is above goal <70 on high-intensity statin. Pt has HeFH with [place] Lipid Score of 8 (5 points for baseline LDL 255, 2 points for premature CAD, 1 point for family history of premature CAD). Continue rosuvastatin 40 mg once daily. Discussed safety and efficacy profile of PCSK9 inhibitors with pt. He is amendable to starting either Repatha or Praluent pending insurance coverage to bring his LDL to goal. Will submit prior authorization and follow up with pt once approved. Encouraged  him to continue current lifestyle modifications. Will recheck labs 2 to 3 months. ? ?Patient seen with Debria Garret, [employer] pharmacy student ? ?Megan E. Supple, PharmD, BCACP, CPP ?[id number], [place], [place] [address] ?Phone: [phone]; Fax: [phone] ?04/12/2022 11:27 AM ? ? ?

## 2022-04-12 ENCOUNTER — Ambulatory Visit (INDEPENDENT_AMBULATORY_CARE_PROVIDER_SITE_OTHER): Payer: Medicare Other | Admitting: Pharmacist

## 2022-04-12 ENCOUNTER — Encounter: Payer: Self-pay | Admitting: Pharmacist

## 2022-04-12 ENCOUNTER — Telehealth: Payer: Self-pay | Admitting: Pharmacist

## 2022-04-12 DIAGNOSIS — E7849 Other hyperlipidemia: Secondary | ICD-10-CM

## 2022-04-12 DIAGNOSIS — I2510 Atherosclerotic heart disease of native coronary artery without angina pectoris: Secondary | ICD-10-CM

## 2022-04-12 MED ORDER — REPATHA SURECLICK 140 MG/ML ~~LOC~~ SOAJ: 1.0000 "pen " | SUBCUTANEOUS | 11 refills | Status: DC

## 2022-04-12 MED ORDER — REPATHA SURECLICK 140 MG/ML ~~LOC~~ SOAJ: 1.0000 "pen " | SUBCUTANEOUS | 3 refills | Status: DC

## 2022-04-12 NOTE — Telephone Encounter (Signed)
Repatha prior authorization approved through 04/12/23. Rx sent to pharmacy to determine copay, then will reach out to pt. ?

## 2022-04-12 NOTE — Telephone Encounter (Signed)
Rick Love from [facility] calling to speak with Rick Love again regarding this patients Repatha. Please advise.  ?

## 2022-04-12 NOTE — Telephone Encounter (Signed)
Spoke with pt and enrolled him in [employer]. Provided him with info via mychart to bring to pharmacy to reduce his copay to $0. Scheduled f/u labs in June to assess efficacy. ?

## 2022-04-12 NOTE — Telephone Encounter (Signed)
Copay $646/3 months or $550/1 month due to high deductible on his part D plan.  ?

## 2022-04-12 NOTE — Patient Instructions (Signed)
Your LDL goal is <40 ? ?Continue rosuvastatin 40 mg daily.  ? ?I will submit information to your insurance for Repatha or Praluent and let you know when I hear back.  ?  ?They are a subcutaneous injection given once every 2 weeks in the fatty tissue of your stomach or upper outer thigh. Store the medication in the fridge. You can let your dose warm up to room temperature for 30 minutes before injecting if you prefer. They will lower your LDL cholesterol by 60% and helps to lower your chance of having a heart attack or stroke. ? ? ?

## 2022-06-17 ENCOUNTER — Other Ambulatory Visit: Payer: Medicare Other

## 2022-06-17 DIAGNOSIS — E7849 Other hyperlipidemia: Secondary | ICD-10-CM

## 2022-06-17 LAB — LIPID PANEL
Chol/HDL Ratio: 1.7 ratio (ref 0.0–5.0)
Cholesterol, Total: 87 mg/dL — ABNORMAL LOW (ref 100–199)
HDL: 50 mg/dL (ref >39)
LDL Chol Calc (NIH): 24 mg/dL (ref 0–99)
Triglycerides: 54 mg/dL (ref 0–149)
VLDL Cholesterol Cal: 13 mg/dL (ref 5–40)

## 2022-06-17 LAB — HEPATIC FUNCTION PANEL
ALT: 26 IU/L (ref 0–44)
AST: 26 IU/L (ref 0–40)
Albumin: 4.5 g/dL (ref 3.8–4.8)
Alkaline Phosphatase: 67 IU/L (ref 44–121)
Bilirubin Total: 0.6 mg/dL (ref 0.0–1.2)
Bilirubin, Direct: 0.23 mg/dL (ref 0.00–0.40)
Total Protein: 6.2 g/dL (ref 6.0–8.5)

## 2022-10-09 ENCOUNTER — Encounter: Payer: Self-pay | Admitting: Nurse Practitioner

## 2022-10-09 ENCOUNTER — Ambulatory Visit: Payer: Medicare Other | Attending: Nurse Practitioner | Admitting: Nurse Practitioner

## 2022-10-09 VITALS — BP 100/60 | HR 63 | Ht 70.0 in | Wt 157.0 lb

## 2022-10-09 DIAGNOSIS — R001 Bradycardia, unspecified: Secondary | ICD-10-CM

## 2022-10-09 DIAGNOSIS — I10 Essential (primary) hypertension: Secondary | ICD-10-CM | POA: Diagnosis present

## 2022-10-09 DIAGNOSIS — I2510 Atherosclerotic heart disease of native coronary artery without angina pectoris: Secondary | ICD-10-CM

## 2022-10-09 DIAGNOSIS — E7849 Other hyperlipidemia: Secondary | ICD-10-CM | POA: Diagnosis present

## 2022-10-09 NOTE — Progress Notes (Signed)
Office Visit    Patient Name: Rick Love Date of Encounter: 10/09/2022  Primary Care Provider:  Orpah Melter, MD Primary Cardiologist:  None Primary Electrophysiologist: None  Chief Complaint    Rick Love is a 71 y.o. male with PMH of HLD, premature CAD, bradycardia, asthma, prostate cancer who presents today for 82-monthfollow-up of coronary artery disease.  Past Medical History    Past Medical History:  Diagnosis Date   Asthma    Coronary artery disease involving native coronary artery of native heart without angina pectoris 03/20/2022   Calcium score 08/2018 was 128 which is 62nd percentile  Mild ostial/proximal LAD stenosis with calcified plaque.   Familial hyperlipidemia 10/21/2013   225 untreated LDL   Family history of ischemic heart disease 10/21/2013   Hearing loss in right ear 10/21/2013   Transient   Histoplasmosis    Meralgia paresthetica    [place]   Sinus bradycardia 10/21/2013   Asymptomatic   Past Surgical History:  Procedure Laterality Date   no surgical hx      Allergies  Allergies  Allergen Reactions   Tape Rash    History of Present Illness    Rick Love is a 71year old male with the above mention past medical history who presents today for 636-monthollow-up of coronary artery disease.  Rick Love initially seen by Dr. SkMarlou Porchn 2014 for sinus bradycardia and history of CAD.  He completed a nuclear stress test 05/2011 that was low risk and showed no ischemia with ST segments noted during test.  He underwent cardiac CT to quantify his risk of CAD that showed calcium score of 128 which placed him at 62%.  He was placed on Crestor 20 mg and instructed to follow-up primary prevention for coronary artery disease.  He was last seen by Dr. SkMarlou Porchn 02/2022 for annual follow-up.  Patient's Crestor was still elevated following increase in Crestor to 40 mg.  He was referred to lipid clinic and is currently on  Praluent with improved LDL numbers.  No additional medication changes were made during that visit.  Rick Love for 6-60-monthllow-up since last being seen in the office patient reports that he has been doing well and has no new cardiac complaints.  He is tolerating his PCSK9 inhibitor without any adverse reactions.  He is being followed in the lipid clinic for management. Patient's blood pressure today was well controlled at 100/60 and heart rate was 63 bpm.  He is currently walking for exercise and has lost 18 pounds intentionally.  He credits his success to my fitness pal and his Fitbit app.  Patient denies chest pain, palpitations, dyspnea, PND, orthopnea, nausea, vomiting, dizziness, syncope, edema, weight gain, or early satiety.  Home Medications    Current Outpatient Medications  Medication Sig Dispense Refill   albuterol (PROVENTIL HFA) 108 (90 BASE) MCG/ACT inhaler INHALE TWO PUFFS EVERY FOUR TO SIX HOURS AS NEEDED FOR COUGH OR WHEEZE. 1 Inhaler 1   cetirizine (ZYRTEC) 5 MG tablet Take 5 mg by mouth daily.     Cholecalciferol (VITAMIN D) 125 MCG (5000 UT) CAPS Take 1 capsule by mouth daily.     Evolocumab (REPATHA SURECLICK) 140182/ML SOAJ Inject 1 pen. into the skin every 14 (fourteen) days. 6 mL 3   ipratropium (ATROVENT) 0.06 % nasal spray Place 2 sprays into both nostrils every 6 (six) hours as needed for rhinitis. 15 mL 5  lisinopril (ZESTRIL) 5 MG tablet Take 5 mg by mouth daily.     loratadine (CLARITIN) 10 MG tablet Take 10 mg by mouth daily.     Rosuvastatin Calcium 40 MG CPSP Take 1 tablet by mouth daily.     No current facility-administered medications for this visit.     Review of Systems  Please see the history of present illness.    All other systems reviewed and are otherwise negative except as noted above.  Physical Exam    Wt Readings from Last 3 Encounters:  10/09/22 157 lb (71.2 kg)  03/20/22 174 lb (78.9 kg)  07/09/18 170 lb 9.6 oz (77.4  kg)   VS: Vitals:   10/09/22 1134  BP: 100/60  Pulse: 63  SpO2: 96%  ,Body mass index is 22.53 kg/m.  Constitutional:      Appearance: Healthy appearance. Not in distress.  Neck:     Vascular: JVD normal.  Pulmonary:     Effort: Pulmonary effort is normal.     Breath sounds: No wheezing. No rales. Diminished in the bases Cardiovascular:     Normal rate. Regular rhythm. Normal S1. Normal S2.      Murmurs: There is no murmur.  Edema:    Peripheral edema absent.  Abdominal:     Palpations: Abdomen is soft non tender. There is no hepatomegaly.  Skin:    General: Skin is warm and dry.  Neurological:     General: No focal deficit present.     Mental Status: Alert and oriented to person, place and time.     Cranial Nerves: Cranial nerves are intact.  EKG/LABS/Other Studies Reviewed    ECG personally reviewed by me today -none completed today    No results found for: "WBC", "HGB", "HCT", "MCV", "PLT" Lab Results  Component Value Date   CREATININE 0.92 07/29/2018   BUN 18 07/29/2018   NA 147 (H) 07/29/2018   K 4.1 07/29/2018   CL 106 07/29/2018   CO2 27 07/29/2018   Lab Results  Component Value Date   ALT 26 06/17/2022   AST 26 06/17/2022   ALKPHOS 67 06/17/2022   BILITOT 0.6 06/17/2022   Lab Results  Component Value Date   CHOL 87 (L) 06/17/2022   HDL 50 06/17/2022   LDLCALC 24 06/17/2022   TRIG 54 06/17/2022   CHOLHDL 1.7 06/17/2022    No results found for: "HGBA1C"  Assessment & Plan    1.  Nonobstructive CAD: -Cardiac CTA completed in 2019 with calcium score noted of origin 28 and mild calcified plaque noted in the ostial and proximal LAD. -Patient has strong family history of atherosclerotic coronary disease -Today patient reports no cardiac complaints or symptoms.  He is currently exercising and has lost a total of 17 pounds since his previous visit. -He is currently compliant with his Crestor and PCSK9 inhibitor -Continue primary prevention with  diet and exercise  2.  Hypercholesteremia: -Patient's most recent LDL was 24 at goal of less than 70 -Continue Crestor 40 mg daily and Repatha 140 mg every 14 days  3.  Hypertension: -Patient's blood pressure today was well controlled at 100/60 -Continue Zestril 5 mg daily Disposition: Follow-up with None or APP in 12 months    Medication Adjustments/Labs and Tests Ordered: Current medicines are reviewed at length with the patient today.  Concerns regarding medicines are outlined above.   Signed, Mable Fill, Marissa Nestle, NP 10/09/2022, 11:48 AM [facility]  Note:  This document was prepared using Dragon voice recognition software and may include unintentional dictation errors.

## 2022-10-09 NOTE — Patient Instructions (Signed)
Medication Instructions:  Your physician recommends that you continue on your current medications as directed. Please refer to the Current Medication list given to you today.  *If you need a refill on your cardiac medications before your next appointment, please call your pharmacy*  Follow-Up: At  [facility]Care, you and your health needs are our priority.  As part of our continuing mission to provide you with exceptional heart care, we have created designated Provider Care Teams.  These Care Teams include your primary Cardiologist (physician) and Advanced Practice Providers (APPs -  Physician Assistants and Nurse Practitioners) who all work together to provide you with the care you need, when you need it.  Your next appointment:   1 year(s)  The format for your next appointment:   In Person  Provider:   Mark Skains, MD  Important Information About Sugar       

## 2023-03-28 ENCOUNTER — Encounter: Payer: Self-pay | Admitting: Cardiology

## 2023-04-02 ENCOUNTER — Other Ambulatory Visit (HOSPITAL_COMMUNITY): Payer: Self-pay

## 2023-04-02 ENCOUNTER — Telehealth: Payer: Self-pay

## 2023-04-02 NOTE — Telephone Encounter (Signed)
  F/u from chart note on 4/3.   After some research here. I was made aware from the pts plan that the pt initiated the Prior Auth himself on 03/26/23 for Repatha 140mg /ml. Which in return needed staff personnel to answer Prior Authorization questions by a certain. Which resulted in an appeal. The Appeal has now been submitted and also has a turn around time of 7 business days.  CASE # [id number]

## 2023-04-03 NOTE — Telephone Encounter (Signed)
Pharmacy Patient Advocate Encounter  Prior Authorization for Repatha 140mg /ml has been Approved by [facility]  (ins).     Effective dates: 1.1.24 through 12.31.24

## 2023-04-03 NOTE — Telephone Encounter (Signed)
Pt made aware appeals is pending.

## 2023-04-03 NOTE — Telephone Encounter (Signed)
Pt made aware of approval

## 2023-05-02 ENCOUNTER — Other Ambulatory Visit: Payer: Self-pay | Admitting: Cardiology

## 2023-07-09 ENCOUNTER — Encounter (INDEPENDENT_AMBULATORY_CARE_PROVIDER_SITE_OTHER): Payer: Self-pay

## 2023-07-10 ENCOUNTER — Ambulatory Visit: Payer: Medicare Other | Admitting: Allergy and Immunology

## 2023-07-16 NOTE — Progress Notes (Unsigned)
New Patient Note  RE: Rick Love MRN: [id number] DOB: [birth year] Date of Office Visit: 07/17/2023  Consult requested by: Joycelyn Rua, MD Primary care provider: Joycelyn Rua, MD  Chief Complaint: No chief complaint on file.  History of Present Illness: I had the pleasure of seeing Rick Love for initial evaluation at the [facility] on 07/16/2023. He is a 72 y.o. male, who is referred here by Joycelyn Rua, MD for the evaluation of ***.  ***  Assessment and Plan: Rick Love is a 72 y.o. male with: No problem-specific Assessment & Plan notes found for this encounter.  No follow-ups on file.  No orders of the defined types were placed in this encounter.  Lab Orders  No laboratory test(s) ordered today    Other allergy screening: Asthma: {Blank single:19197::"yes","no"} Rhino conjunctivitis: {Blank single:19197::"yes","no"} Food allergy: {Blank single:19197::"yes","no"} Medication allergy: {Blank single:19197::"yes","no"} Hymenoptera allergy: {Blank single:19197::"yes","no"} Urticaria: {Blank single:19197::"yes","no"} Eczema:{Blank single:19197::"yes","no"} History of recurrent infections suggestive of immunodeficency: {Blank single:19197::"yes","no"}  Diagnostics: Spirometry:  Tracings reviewed. His effort: {Blank single:19197::"Good reproducible efforts.","It was hard to get consistent efforts and there is a question as to whether this reflects a maximal maneuver.","Poor effort, data can not be interpreted."} FVC: ***L FEV1: ***L, ***% predicted FEV1/FVC ratio: ***% Interpretation: {Blank single:19197::"Spirometry consistent with mild obstructive disease","Spirometry consistent with moderate obstructive disease","Spirometry consistent with severe obstructive disease","Spirometry consistent with possible restrictive disease","Spirometry consistent with mixed obstructive and restrictive disease","Spirometry uninterpretable due to  technique","Spirometry consistent with normal pattern","No overt abnormalities noted given today's efforts"}.  Please see scanned spirometry results for details.  Skin Testing: {Blank single:19197::"Select foods","Environmental allergy panel","Environmental allergy panel and select foods","Food allergy panel","None","Deferred due to recent antihistamines use"}. *** Results discussed with patient/family.   Past Medical History: Patient Active Problem List   Diagnosis Date Noted  . Coronary artery disease involving native coronary artery of native heart without angina pectoris 03/20/2022  . Possible component of asthma 09/02/2015  . Allergic rhinoconjunctivitis 09/02/2015  . Laryngopharyngeal reflux (LPR) 09/02/2015  . Other fatigue 10/28/2014  . Familial hyperlipidemia 10/21/2013  . Family history of ischemic heart disease 10/21/2013  . Hearing loss in right ear 10/21/2013  . Sinus bradycardia 10/21/2013   Past Medical History:  Diagnosis Date  . Asthma   . Coronary artery disease involving native coronary artery of native heart without angina pectoris 03/20/2022   Calcium score 08/2018 was 128 which is 62nd percentile  Mild ostial/proximal LAD stenosis with calcified plaque.  . Familial hyperlipidemia 10/21/2013   225 untreated LDL  . Family history of ischemic heart disease 10/21/2013  . Hearing loss in right ear 10/21/2013   Transient  . Histoplasmosis   . Meralgia paresthetica   . Prostate cancer (HCC) 01/08/2010  . Sinus bradycardia 10/21/2013   Asymptomatic   Past Surgical History: Past Surgical History:  Procedure Laterality Date  . no surgical hx     Medication List:  Current Outpatient Medications  Medication Sig Dispense Refill  . albuterol (PROVENTIL HFA) 108 (90 BASE) MCG/ACT inhaler INHALE TWO PUFFS EVERY FOUR TO SIX HOURS AS NEEDED FOR COUGH OR WHEEZE. 1 Inhaler 1  . cetirizine (ZYRTEC) 5 MG tablet Take 5 mg by mouth daily.    . Cholecalciferol (VITAMIN D)  125 MCG (5000 UT) CAPS Take 1 capsule by mouth daily.    . Evolocumab (REPATHA SURECLICK) 140 MG/ML SOAJ INJECT 1 PEN. INTO THE SKIN EVERY 14 (FOURTEEN) DAYS. 6 mL 3  . ipratropium (ATROVENT) 0.06 % nasal spray Place 2 sprays  into both nostrils every 6 (six) hours as needed for rhinitis. 15 mL 5  . lisinopril (ZESTRIL) 5 MG tablet Take 5 mg by mouth daily.    Marland Kitchen loratadine (CLARITIN) 10 MG tablet Take 10 mg by mouth daily.    . Rosuvastatin Calcium 40 MG CPSP Take 1 tablet by mouth daily.     No current facility-administered medications for this visit.   Allergies: Allergies  Allergen Reactions  . Tape Rash   Social History: Social History   Socioeconomic History  . Marital status: Married    Spouse name: Not on file  . Number of children: Not on file  . Years of education: Not on file  . Highest education level: Not on file  Occupational History  . Not on file  Tobacco Use  . Smoking status: Never  . Smokeless tobacco: Never  Substance and Sexual Activity  . Alcohol use: Not on file  . Drug use: Not on file  . Sexual activity: Not on file  Other Topics Concern  . Not on file  Social History Narrative  . Not on file   Social Determinants of Health   Financial Resource Strain: Not on file  Food Insecurity: Not on file  Transportation Needs: Not on file  Physical Activity: Not on file  Stress: Not on file  Social Connections: Unknown (05/06/2022)   [facility]   Social Network   . Social Network: Not on file   Lives in a ***. Smoking: *** Occupation: ***  Environmental HistorySurveyor, minerals in the house: Copywriter, advertising in the family room: {Blank single:19197::"yes","no"} Carpet in the bedroom: {Blank single:19197::"yes","no"} Heating: {Blank single:19197::"electric","gas","heat pump"} Cooling: {Blank single:19197::"central","window","heat pump"} Pet: {Blank single:19197::"yes ***","no"}  Family  History: Family History  Problem Relation Age of Onset  . Hypertension Mother   . Heart attack Father    Problem                               Relation Asthma                                   *** Eczema                                *** Food allergy                          *** Allergic rhino conjunctivitis     ***  Review of Systems  Constitutional:  Negative for appetite change, chills, fever and unexpected weight change.  HENT:  Negative for congestion and rhinorrhea.   Eyes:  Negative for itching.  Respiratory:  Negative for cough, chest tightness, shortness of breath and wheezing.   Cardiovascular:  Negative for chest pain.  Gastrointestinal:  Negative for abdominal pain.  Genitourinary:  Negative for difficulty urinating.  Skin:  Negative for rash.  Neurological:  Negative for headaches.   Objective: There were no vitals taken for this visit. There is no height or weight on file to calculate BMI. Physical Exam Vitals and nursing note reviewed.  Constitutional:      Appearance: Normal appearance. He is well-developed.  HENT:     Head: Normocephalic and atraumatic.     Right Ear: Tympanic membrane and  external ear normal.     Left Ear: Tympanic membrane and external ear normal.     Nose: Nose normal.     Mouth/Throat:     Mouth: Mucous membranes are moist.     Pharynx: Oropharynx is clear.  Eyes:     Conjunctiva/sclera: Conjunctivae normal.  Cardiovascular:     Rate and Rhythm: Normal rate and regular rhythm.     Heart sounds: Normal heart sounds. No murmur heard.    No friction rub. No gallop.  Pulmonary:     Effort: Pulmonary effort is normal.     Breath sounds: Normal breath sounds. No wheezing, rhonchi or rales.  Musculoskeletal:     Cervical back: Neck supple.  Skin:    General: Skin is warm.     Findings: No rash.  Neurological:     Mental Status: He is alert and oriented to person, place, and time.  Psychiatric:        Behavior: Behavior normal.   The plan was reviewed with the patient/family, and all questions/concerned were addressed.  It was my pleasure to see Rick Love today and participate in his care. Please feel free to contact me with any questions or concerns.  Sincerely,  Wyline Mood, DO Allergy & Immunology  [facility] office: [phone] [facility] office: [phone]

## 2023-07-17 ENCOUNTER — Encounter: Payer: Self-pay | Admitting: Allergy

## 2023-07-17 ENCOUNTER — Other Ambulatory Visit: Payer: Self-pay

## 2023-07-17 ENCOUNTER — Ambulatory Visit: Payer: Medicare Other | Admitting: Allergy

## 2023-07-17 VITALS — BP 130/68 | HR 71 | Temp 97.7°F | Resp 18 | Ht 69.29 in | Wt 161.8 lb

## 2023-07-17 DIAGNOSIS — J301 Allergic rhinitis due to pollen: Secondary | ICD-10-CM

## 2023-07-17 DIAGNOSIS — J4520 Mild intermittent asthma, uncomplicated: Secondary | ICD-10-CM | POA: Diagnosis not present

## 2023-07-17 DIAGNOSIS — J3089 Other allergic rhinitis: Secondary | ICD-10-CM

## 2023-07-17 DIAGNOSIS — J310 Chronic rhinitis: Secondary | ICD-10-CM

## 2023-07-17 DIAGNOSIS — Z91038 Other insect allergy status: Secondary | ICD-10-CM

## 2023-07-17 DIAGNOSIS — J3081 Allergic rhinitis due to animal (cat) (dog) hair and dander: Secondary | ICD-10-CM

## 2023-07-17 MED ORDER — MONTELUKAST SODIUM 10 MG PO TABS: 10.0000 mg | ORAL_TABLET | Freq: Every day | ORAL | 5 refills | Status: DC

## 2023-07-17 MED ORDER — AIRSUPRA 90-80 MCG/ACT IN AERO: 2.0000 | INHALATION_SPRAY | RESPIRATORY_TRACT | 2 refills | Status: DC | PRN

## 2023-07-17 MED ORDER — IPRATROPIUM BROMIDE 0.06 % NA SOLN: 1.0000 | Freq: Four times a day (QID) | NASAL | 11 refills | Status: DC | PRN

## 2023-07-17 NOTE — Assessment & Plan Note (Signed)
You may use the Atrovent nasal spray before eating as above.

## 2023-07-17 NOTE — Assessment & Plan Note (Signed)
Perennial rhinitis symptoms for 30+ years.  Used to be on AIT and last injection was in 2020 for dust mites and dog.  Unsure if effective.  1 dog at home.  Currently using ipratropium nasal spray and over-the-counter antihistamines as needed with good benefit. Today's skin testing showed: Positive to trees, ragweed, weed, dust mites, dog, cockroach. Start environmental control measures as below. Use over the counter antihistamines such as Zyrtec (cetirizine), Claritin (loratadine), Allegra (fexofenadine), or Xyzal (levocetirizine) daily as needed. May take twice a day during allergy flares. May switch antihistamines every few months. Start Singulair (montelukast) 10mg  daily at night. Cautioned that in some children/adults can experience behavioral changes including hyperactivity, agitation, depression, sleep disturbances and suicidal ideations. These side effects are rare, but if you notice them you should notify me and discontinue Singulair (montelukast). Use Atrovent (ipratropium) 0.06% 1-2 sprays per nostril up to four times a day as needed for runny nose/drainage. Nasal saline spray (i.e., Simply Saline) or nasal saline lavage (i.e., NeilMed) is recommended as needed and prior to medicated nasal sprays. Consider allergy injections for long term control if above medications do not help the symptoms - handout given.

## 2023-07-17 NOTE — Assessment & Plan Note (Signed)
Coughing and wheezing about once per week and does not use albuterol as it does not seem to be effective. Patient tried various inhalers in the past such as Symbicort with minimal benefit. Today's spirometry shows some restriction. May use Airsupra rescue inhaler 2 puffs every 4 to 6 hours as needed for shortness of breath, chest tightness, coughing, and wheezing. Do not use more than 12 puffs in 24 hours. Monitor frequency of use. Rinse mouth after each use.  Sample given. If not covered then okay to use albuterol as before.

## 2023-07-17 NOTE — Patient Instructions (Addendum)
Today's skin testing showed: Positive to trees, ragweed, weed, dust mites, dog, cockroach.  Results given.  Environmental allergies Start environmental control measures as below. Use over the counter antihistamines such as Zyrtec (cetirizine), Claritin (loratadine), Allegra (fexofenadine), or Xyzal (levocetirizine) daily as needed. May take twice a day during allergy flares. May switch antihistamines every few months. Start Singulair (montelukast) 10mg  daily at night. Cautioned that in some children/adults can experience behavioral changes including hyperactivity, agitation, depression, sleep disturbances and suicidal ideations. These side effects are rare, but if you notice them you should notify me and discontinue Singulair (montelukast). Use Atrovent (ipratropium) 0.06% 1-2 sprays per nostril up to four times a day as needed for runny nose/drainage. Nasal saline spray (i.e., Simply Saline) or nasal saline lavage (i.e., NeilMed) is recommended as needed and prior to medicated nasal sprays.  Consider allergy injections for long term control if above medications do not help the symptoms - handout given.   Gustatory rhinitis You may use the Atrovent nasal spray before eating as above.   Asthma May use Airsupra rescue inhaler 2 puffs every 4 to 6 hours as needed for shortness of breath, chest tightness, coughing, and wheezing. Do not use more than 12 puffs in 24 hours. Monitor frequency of use. Rinse mouth after each use.  Sample given. If not covered then okay to use albuterol as before.   Follow up in 6 months or sooner if needed.    Reducing Pollen Exposure Pollen seasons: trees (spring), grass (summer) and ragweed/weeds (fall). Keep windows closed in your home and car to lower pollen exposure.  Install air conditioning in the bedroom and throughout the house if possible.  Avoid going out in dry windy days - especially early morning. Pollen counts are highest between 5 - 10 AM and on  dry, hot and windy days.  Save outside activities for late afternoon or after a heavy rain, when pollen levels are lower.  Avoid mowing of grass if you have grass pollen allergy. Be aware that pollen can also be transported indoors on people and pets.  Dry your clothes in an automatic dryer rather than hanging them outside where they might collect pollen.  Rinse hair and eyes before bedtime.  Cockroach Allergen Avoidance Cockroaches are often found in the homes of densely populated urban areas, schools or commercial buildings, but these creatures can lurk almost anywhere. This does not mean that you have a dirty house or living area. Block all areas where roaches can enter the home. This includes crevices, wall cracks and windows.  Cockroaches need water to survive, so fix and seal all leaky faucets and pipes. Have an exterminator go through the house when your family and pets are gone to eliminate any remaining roaches. Keep food in lidded containers and put pet food dishes away after your pets are done eating. Vacuum and sweep the floor after meals, and take out garbage and recyclables. Use lidded garbage containers in the kitchen. Wash dishes immediately after use and clean under stoves, refrigerators or toasters where crumbs can accumulate. Wipe off the stove and other kitchen surfaces and cupboards regularly.  Control of House Dust Mite Allergen Dust mite allergens are a common trigger of allergy and asthma symptoms. While they can be found throughout the house, these microscopic creatures thrive in warm, humid environments such as bedding, upholstered furniture and carpeting. Because so much time is spent in the bedroom, it is essential to reduce mite levels there.  Encase pillows, mattresses, and box springs  in special allergen-proof fabric covers or airtight, zippered plastic covers.  Bedding should be washed weekly in hot water (130 F) and dried in a hot dryer. Allergen-proof covers are  available for comforters and pillows that can't be regularly washed.  Wash the allergy-proof covers every few months. Minimize clutter in the bedroom. Keep pets out of the bedroom.  Keep humidity less than 50% by using a dehumidifier or air conditioning. You can buy a humidity measuring device called a hygrometer to monitor this.  If possible, replace carpets with hardwood, linoleum, or washable area rugs. If that's not possible, vacuum frequently with a vacuum that has a HEPA filter. Remove all upholstered furniture and non-washable window drapes from the bedroom. Remove all non-washable stuffed toys from the bedroom.  Wash stuffed toys weekly.  Pet Allergen Avoidance: Contrary to popular opinion, there are no "hypoallergenic" breeds of dogs or cats. That is because people are not allergic to an animal's hair, but to an allergen found in the animal's saliva, dander (dead skin flakes) or urine. Pet allergy symptoms typically occur within minutes. For some people, symptoms can build up and become most severe 8 to 12 hours after contact with the animal. People with severe allergies can experience reactions in public places if dander has been transported on the pet owners' clothing. Keeping an animal outdoors is only a partial solution, since homes with pets in the yard still have higher concentrations of animal allergens. Before getting a pet, ask your allergist to determine if you are allergic to animals. If your pet is already considered part of your family, try to minimize contact and keep the pet out of the bedroom and other rooms where you spend a great deal of time. As with dust mites, vacuum carpets often or replace carpet with a hardwood floor, tile or linoleum. High-efficiency particulate air (HEPA) cleaners can reduce allergen levels over time. While dander and saliva are the source of cat and dog allergens, urine is the source of allergens from rabbits, hamsters, mice and [place] pigs; so ask a  non-allergic family member to clean the animal's cage. If you have a pet allergy, talk to your allergist about the potential for allergy immunotherapy (allergy shots). This strategy can often provide long-term relief.

## 2023-08-29 ENCOUNTER — Other Ambulatory Visit: Payer: Self-pay | Admitting: Medical Genetics

## 2023-08-29 DIAGNOSIS — Z006 Encounter for examination for normal comparison and control in clinical research program: Secondary | ICD-10-CM

## 2023-10-03 ENCOUNTER — Encounter (INDEPENDENT_AMBULATORY_CARE_PROVIDER_SITE_OTHER): Payer: Self-pay

## 2023-10-23 NOTE — Progress Notes (Signed)
Cardiology Office Note    Patient Name: Rick Love Date of Encounter: 10/23/2023  Primary Care Provider:  Sheliah Hatch, PA-C Primary Cardiologist:  Donato Schultz, MD Primary Electrophysiologist: None   Past Medical History    Past Medical History:  Diagnosis Date   Asthma    Coronary artery disease involving native coronary artery of native heart without angina pectoris 03/20/2022   Calcium score 08/2018 was 128 which is 62nd percentile  Mild ostial/proximal LAD stenosis with calcified plaque.   Familial hyperlipidemia 10/21/2013   225 untreated LDL   Family history of ischemic heart disease 10/21/2013   Hearing loss in right ear 10/21/2013   Transient   Histoplasmosis    Meralgia paresthetica    Prostate cancer (HCC) 01/08/2010   Sinus bradycardia 10/21/2013   Asymptomatic    History of Present Illness  Rick Love is a 72 y.o. male with PMH of HLD, premature CAD, bradycardia, asthma, prostate cancer who presents today for 63-month follow-up of coronary artery disease.   Rick Love was last seen on 10/09/2022 for 52-month follow-up of coronary artery disease.  During visit patient reported doing well with no new cardiac complaints.  He was tolerating his PCSK9 inhibitor without any reactions.  Patient had lost 18 pounds intentionally and was exercising and walking regularly.  No adjustments made to his medication regimen during his previous visit.  During today's visit the patient reports that he has been doing well with no new cardiac complaints since previous follow-up visit.  He is following a heart healthy diet and is watching his sodium intake.  His blood pressure today is controlled at 120/60 and heart rate was 60 bpm.  He does report swelling in his hands that occurs while riding his treadmill.  He also has noticed some swelling in his lower extremities that has occurred over the past few months.  He denies any chest pain or shortness of breath with activity.  Patient  denies chest pain, palpitations, dyspnea, PND, orthopnea, nausea, vomiting, dizziness, syncope, edema, weight gain, or early satiety.  Review of Systems  Please see the history of present illness.    All other systems reviewed and are otherwise negative except as noted above.  Physical Exam    Wt Readings from Last 3 Encounters:  07/17/23 161 lb 12.8 oz (73.4 kg)  10/09/22 157 lb (71.2 kg)  03/20/22 174 lb (78.9 kg)   [id number] were no vitals filed for this visit.,There is no height or weight on file to calculate BMI. GEN: Well nourished, well developed in no acute distress Neck: No JVD; No carotid bruits Pulmonary: Clear to auscultation without rales, wheezing or rhonchi  Cardiovascular: Normal rate. Regular rhythm. Normal S1. Normal S2.   Murmurs: There is no murmur.  ABDOMEN: Soft, non-tender, non-distended EXTREMITIES:  No edema; No deformity   EKG/LABS/ Recent Cardiac Studies   ECG personally reviewed by me today -sinus rhythm with a rate of 60 bpm with no acute changes consistent with previous EKG.  Risk Assessment/Calculations:          No results found for: "WBC", "HGB", "HCT", "MCV", "PLT" Lab Results  Component Value Date   CREATININE 0.92 07/29/2018   BUN 18 07/29/2018   NA 147 (H) 07/29/2018   K 4.1 07/29/2018   CL 106 07/29/2018   CO2 27 07/29/2018   Lab Results  Component Value Date   CHOL 87 (L) 06/17/2022   HDL 50 06/17/2022   LDLCALC 24 06/17/2022   TRIG 54 06/17/2022  CHOLHDL 1.7 06/17/2022    No results found for: "HGBA1C" Assessment & Plan    1.  Nonobstructive CAD: -Cardiac CTA completed in 2019 with calcium score noted of origin 28 and mild calcified plaque noted in the ostial and proximal LAD. -Patient has strong family history of atherosclerotic coronary disease -Today patient reports no chest pain or angina since previous follow-up.  2.  Hypercholesteremia: -Patient's most recent LDL was 45 -Continue Crestor 40 mg daily  3.   Hypertension: -Patient's blood pressure today was well-controlled at 120/60 -Continue lisinopril 5 mg daily  4.  Lower extremity edema: -Patient reports upper and lower extremity edema with no shortness of breath -We will check 2D echo to rule out possible valvular abnormality or structural heart changes.  Disposition: Follow-up with Donato Schultz, MD or APP in 12 months    Signed, Napoleon Form, Leodis Rains, NP 10/23/2023, 2:24 PM [facility]

## 2023-10-24 ENCOUNTER — Encounter: Payer: Self-pay | Admitting: Nurse Practitioner

## 2023-10-24 ENCOUNTER — Ambulatory Visit: Payer: Medicare Other | Attending: Nurse Practitioner | Admitting: Nurse Practitioner

## 2023-10-24 VITALS — BP 120/60 | HR 60 | Ht 69.29 in | Wt 166.2 lb

## 2023-10-24 DIAGNOSIS — I2510 Atherosclerotic heart disease of native coronary artery without angina pectoris: Secondary | ICD-10-CM | POA: Insufficient documentation

## 2023-10-24 DIAGNOSIS — R600 Localized edema: Secondary | ICD-10-CM | POA: Diagnosis present

## 2023-10-24 DIAGNOSIS — R001 Bradycardia, unspecified: Secondary | ICD-10-CM | POA: Insufficient documentation

## 2023-10-24 DIAGNOSIS — E7849 Other hyperlipidemia: Secondary | ICD-10-CM | POA: Insufficient documentation

## 2023-10-24 DIAGNOSIS — I10 Essential (primary) hypertension: Secondary | ICD-10-CM | POA: Diagnosis present

## 2023-10-24 NOTE — Patient Instructions (Signed)
Medication Instructions:  Your physician recommends that you continue on your current medications as directed. Please refer to the Current Medication list given to you today. *If you need a refill on your cardiac medications before your next appointment, please call your pharmacy*   Lab Work: NONE ORDERED   Testing/Procedures: Your physician has requested that you have an echocardiogram. Echocardiography is a painless test that uses sound waves to create images of your heart. It provides your doctor with information about the size and shape of your heart and how well your heart's chambers and valves are working. This procedure takes approximately one hour. There are no restrictions for this procedure. Please do NOT wear cologne, perfume, aftershave, or lotions (deodorant is allowed). Please arrive 15 minutes prior to your appointment time.  Please note: We ask at that you not bring children with you during ultrasound (echo/ vascular) testing. Due to room size and safety concerns, children are not allowed in the ultrasound rooms during exams. Our front office staff cannot provide observation of children in our lobby area while testing is being conducted. An adult accompanying a patient to their appointment will only be allowed in the ultrasound room at the discretion of the ultrasound technician under special circumstances. We apologize for any inconvenience.   Follow-Up: At [facility], you and your health needs are our priority.  As part of our continuing mission to provide you with exceptional heart care, we have created designated Provider Care Teams.  These Care Teams include your primary Cardiologist (physician) and Advanced Practice Providers (APPs -  Physician Assistants and Nurse Practitioners) who all work together to provide you with the care you need, when you need it.  We recommend signing up for the patient portal called "MyChart".  Sign up information is provided on this  After Visit Summary.  MyChart is used to connect with patients for Virtual Visits (Telemedicine).  Patients are able to view lab/test results, encounter notes, upcoming appointments, etc.  Non-urgent messages can be sent to your provider as well.   To learn more about what you can do with MyChart, go to [email or web address].    Your next appointment:   12 month(s)  Provider:   Donato Schultz, MD     Other Instructions

## 2023-11-26 ENCOUNTER — Ambulatory Visit (HOSPITAL_COMMUNITY): Payer: Medicare Other | Attending: Nurse Practitioner

## 2023-11-26 DIAGNOSIS — R001 Bradycardia, unspecified: Secondary | ICD-10-CM | POA: Diagnosis present

## 2023-11-26 DIAGNOSIS — E7849 Other hyperlipidemia: Secondary | ICD-10-CM | POA: Diagnosis present

## 2023-11-26 DIAGNOSIS — I2510 Atherosclerotic heart disease of native coronary artery without angina pectoris: Secondary | ICD-10-CM

## 2023-11-26 DIAGNOSIS — I10 Essential (primary) hypertension: Secondary | ICD-10-CM

## 2023-11-26 DIAGNOSIS — R600 Localized edema: Secondary | ICD-10-CM | POA: Diagnosis not present

## 2023-11-26 LAB — ECHOCARDIOGRAM COMPLETE
Area-P 1/2: 4.53 cm2
S' Lateral: 2.3 cm

## 2024-01-01 ENCOUNTER — Encounter: Payer: Self-pay | Admitting: Cardiology

## 2024-01-07 ENCOUNTER — Telehealth: Payer: Self-pay | Admitting: Pharmacy Technician

## 2024-01-07 ENCOUNTER — Other Ambulatory Visit (HOSPITAL_COMMUNITY): Payer: Self-pay

## 2024-01-07 NOTE — Telephone Encounter (Signed)
 Pharmacy Patient Advocate Encounter   Received notification from Patient Advice Request messages that prior authorization for repatha  is required/requested.   Insurance verification completed.   The patient is insured through [facility] .   Per test claim: PA required; PA submitted to above mentioned insurance via CoverMyMeds Key/confirmation #/EOC [id number] Status is pending

## 2024-01-08 ENCOUNTER — Other Ambulatory Visit (HOSPITAL_COMMUNITY): Payer: Self-pay

## 2024-01-08 NOTE — Telephone Encounter (Signed)
Pharmacy Patient Advocate Encounter  Received notification from [facility] that Prior Authorization for repatha has been APPROVED from 01/07/24 to 07/06/24. Ran test claim, Copay is $47.00 one month. This test claim was processed through [facility]- copay amounts may vary at other pharmacies due to pharmacy/plan contracts, or as the patient moves through the different stages of their insurance plan.   PA #/Case ID/Reference #: [id number]

## 2024-01-15 ENCOUNTER — Other Ambulatory Visit: Payer: Self-pay | Admitting: Allergy

## 2024-01-15 ENCOUNTER — Ambulatory Visit: Payer: Medicare Other | Admitting: Allergy

## 2024-01-15 ENCOUNTER — Encounter: Payer: Self-pay | Admitting: Allergy

## 2024-01-15 VITALS — BP 90/60 | HR 54 | Temp 98.1°F | Resp 16

## 2024-01-15 DIAGNOSIS — J3089 Other allergic rhinitis: Secondary | ICD-10-CM

## 2024-01-15 DIAGNOSIS — J4520 Mild intermittent asthma, uncomplicated: Secondary | ICD-10-CM

## 2024-01-15 DIAGNOSIS — J301 Allergic rhinitis due to pollen: Secondary | ICD-10-CM

## 2024-01-15 DIAGNOSIS — Z91038 Other insect allergy status: Secondary | ICD-10-CM | POA: Diagnosis not present

## 2024-01-15 DIAGNOSIS — J310 Chronic rhinitis: Secondary | ICD-10-CM

## 2024-01-15 MED ORDER — MONTELUKAST SODIUM 10 MG PO TABS: 10.0000 mg | ORAL_TABLET | Freq: Every day | ORAL | 1 refills | Status: DC

## 2024-01-15 MED ORDER — AZELASTINE HCL 0.1 % NA SOLN: 1.0000 | Freq: Two times a day (BID) | NASAL | 5 refills | Status: AC | PRN

## 2024-01-15 NOTE — Progress Notes (Signed)
Follow Up Note  RE: Rick Love MRN: [id number] DOB: 21-Jul-1951 Date of Office Visit: 01/15/2024  Referring provider: Verl Love Primary care provider: Sheliah Hatch, PA-C  Chief Complaint: Allergic Rhinitis   History of Present Illness: I had the pleasure of seeing Rick Love for a follow up visit at the [facility] on 01/15/2024. He is a 73 y.o. male, who is being followed for allergic rhinitis, asthma. His previous allergy office visit was on 07/17/2023 with Rick Love. Today is a regular follow up visit.  Discussed the use of AI scribe software for clinical note transcription with the patient, who gave verbal consent to proceed.  The patient, with a history of allergies and asthma, reports a generally good health status since the last visit. However, he experienced a period of increased allergy symptoms between Thanksgiving and Christmas, which he initially attributed to a possible infection. The symptoms included a runny nose and a scratchy throat, lasting for about three weeks.  The patient has been taking Singulair, Claritin, and Zyrtec, along with Atrovent as needed for drainage. He also uses Benadryl as needed. Despite this regimen, he reports instances when his nose runs so fast that the medications do not help. He did not notice a significant change in his symptoms after starting Singulair, but he is currently not experiencing any known asthma symptoms.  The patient also reports a problem with constipation. He has tried reducing his intake of allergy pills, which sometimes leads to an increase in allergy symptoms. He has a history of low blood pressure in the mornings and high blood pressure in the evenings, for which he takes medication.  The patient has a dog at home and was previously on allergy shots, but he did not notice a significant improvement from the shots. He has not seen ENT recently.     Assessment and Plan: Rick Love is a 73  y.o. male with: Seasonal allergic rhinitis due to pollen Allergic rhinitis due to dust mite Allergy to cockroaches Past history - perennial rhinitis symptoms for 30+ years.  Used to be on AIT and last injection was in 2020 for dust mites and dog.  Unsure if effective.  1 dog at home.  Currently using ipratropium nasal spray and over-the-counter antihistamines as needed with good benefit. 2024 skin testing positive to trees, ragweed, weed, dust mites, dog, cockroach. Interim history - content with current regimen. Unsure if Singulair helping.  Continue environmental control measures. Use over the counter antihistamines such as Zyrtec (cetirizine), Claritin (loratadine), Allegra (fexofenadine), or Xyzal (levocetirizine) daily as needed. May take twice a day during allergy flares. May switch antihistamines every few months. TRY TO TAKE JUST ONE PILL PER DAY - c/o constipation and antihistamines can worsen it.  Continue Singulair (montelukast) 10mg  daily at night. Use Atrovent (ipratropium) 0.06% 1-2 sprays per nostril up to four times a day as needed for runny nose/drainage OR Use azelastine nasal spray 1-2 sprays per nostril twice a day as needed for runny nose/drainage. If no improvement will refer to ENT next.  Nasal saline spray (i.e., Simply Saline) or nasal saline lavage (i.e., NeilMed) is recommended as needed and prior to medicated nasal sprays. Consider allergy injections for long term control if above medications do not help the symptoms.   Mild intermittent asthma Past history - Coughing and wheezing about once per week and does not use albuterol as it does not seem to be effective. Patient tried various inhalers in the past  such as Symbicort with minimal benefit. 2024 spirometry shows some restriction. Interim history - less coughing (maybe due to Singulair), not sure if inhaler helps.  May use Airsupra rescue inhaler 2 puffs every 4 to 6 hours as needed for shortness of breath, chest  tightness, coughing, and wheezing. Do not use more than 12 puffs in 24 hours. Monitor frequency of use. Rinse mouth after each use.   Return in about 6 months (around 07/14/2024).  Meds ordered this encounter  Medications   montelukast (SINGULAIR) 10 MG tablet    Sig: Take 1 tablet (10 mg total) by mouth at bedtime.    Dispense:  90 tablet    Refill:  1   azelastine (ASTELIN) 0.1 % nasal spray    Sig: Place 1-2 sprays into both nostrils 2 (two) times daily as needed (nasal drainage). Use in each nostril as directed    Dispense:  30 mL    Refill:  5   Lab Orders  No laboratory test(s) ordered today    Diagnostics: None.   Medication List:  Current Outpatient Medications  Medication Sig Dispense Refill   azelastine (ASTELIN) 0.1 % nasal spray Place 1-2 sprays into both nostrils 2 (two) times daily as needed (nasal drainage). Use in each nostril as directed 30 mL 5   cetirizine (ZYRTEC) 5 MG tablet Take 5 mg by mouth daily.     Evolocumab (REPATHA SURECLICK) 140 MG/ML SOAJ INJECT 1 PEN. INTO THE SKIN EVERY 14 (FOURTEEN) DAYS. 6 mL 3   ipratropium (ATROVENT) 0.06 % nasal spray Place 1-2 sprays into both nostrils 4 (four) times daily as needed (runny nose/drainage). 15 mL 11   lisinopril (ZESTRIL) 5 MG tablet Take 5 mg by mouth daily.     loratadine (CLARITIN) 10 MG tablet Take 10 mg by mouth daily.     rosuvastatin (CRESTOR) 20 MG tablet Take 40 mg by mouth daily.     Rosuvastatin Calcium 40 MG CPSP Take 1 tablet by mouth daily.     VITAMIN D PO Take 1 capsule by mouth daily. Pt takes 3,000 mcg daily     albuterol (PROVENTIL HFA) 108 (90 BASE) MCG/ACT inhaler INHALE TWO PUFFS EVERY FOUR TO SIX HOURS AS NEEDED FOR COUGH OR WHEEZE. (Patient not taking: Reported on 01/15/2024) 1 Inhaler 1   Albuterol-Budesonide (AIRSUPRA) 90-80 MCG/ACT AERO Inhale 2 puffs into the lungs every 4 (four) hours as needed (coughing, wheezing, chest tightness). Do not exceed 12 puffs in 24 hours. (Patient not  taking: Reported on 01/15/2024) 10.7 g 2   montelukast (SINGULAIR) 10 MG tablet Take 1 tablet (10 mg total) by mouth at bedtime. 90 tablet 1   No current facility-administered medications for this visit.   Allergies: Allergies  Allergen Reactions   Tape Rash   I reviewed his past medical history, social history, family history, and environmental history and no significant changes have been reported from his previous visit.  Review of Systems  Constitutional:  Negative for appetite change, chills, fever and unexpected weight change.  HENT:  Positive for postnasal drip and rhinorrhea. Negative for congestion.   Eyes:  Negative for itching.  Respiratory:  Negative for cough, chest tightness, shortness of breath and wheezing.   Cardiovascular:  Negative for chest pain.  Gastrointestinal:  Positive for constipation. Negative for abdominal pain.  Genitourinary:  Negative for difficulty urinating.  Skin:  Negative for rash.  Allergic/Immunologic: Positive for environmental allergies.  Neurological:  Negative for headaches.    Objective: BP 90/60  Pulse (!) 54   Temp 98.1 F (36.7 C) (Temporal)   Resp 16   SpO2 96%  There is no height or weight on file to calculate BMI. Physical Exam Vitals and nursing note reviewed.  Constitutional:      Appearance: Normal appearance. He is well-developed.  HENT:     Head: Normocephalic and atraumatic.     Right Ear: Tympanic membrane and external ear normal.     Left Ear: Tympanic membrane and external ear normal.     Nose: Nose normal.     Mouth/Throat:     Mouth: Mucous membranes are moist.     Pharynx: Oropharynx is clear.  Eyes:     Conjunctiva/sclera: Conjunctivae normal.  Cardiovascular:     Rate and Rhythm: Normal rate and regular rhythm.     Heart sounds: Normal heart sounds. No murmur heard.    No friction rub. No gallop.  Pulmonary:     Effort: Pulmonary effort is normal.     Breath sounds: Normal breath sounds. No wheezing,  rhonchi or rales.  Musculoskeletal:     Cervical back: Neck supple.  Skin:    General: Skin is warm.     Findings: No rash.  Neurological:     Mental Status: He is alert and oriented to person, place, and time.  Psychiatric:        Behavior: Behavior normal.   Previous notes and tests were reviewed. The plan was reviewed with the patient/family, and all questions/concerned were addressed.  It was my pleasure to see Rick Love today and participate in his care. Please feel free to contact me with any questions or concerns.  Sincerely,  Wyline Mood, DO Allergy & Immunology  [facility] office: [phone] [facility] office: [phone]

## 2024-01-15 NOTE — Patient Instructions (Addendum)
Environmental allergies 2024 skin testing positive to trees, ragweed, weed, dust mites, dog, cockroach. Continue environmental control measures. Use over the counter antihistamines such as Zyrtec (cetirizine), Claritin (loratadine), Allegra (fexofenadine), or Xyzal (levocetirizine) daily as needed. May take twice a day during allergy flares. May switch antihistamines every few months. TRY TO TAKE JUST ONE PILL PER DAY. Continue Singulair (montelukast) 10mg  daily at night. Use Atrovent (ipratropium) 0.06% 1-2 sprays per nostril up to four times a day as needed for runny nose/drainage OR Use azelastine nasal spray 1-2 sprays per nostril twice a day as needed for runny nose/drainage. If no improvement will refer to ENT next.   Nasal saline spray (i.e., Simply Saline) or nasal saline lavage (i.e., NeilMed) is recommended as needed and prior to medicated nasal sprays. Consider allergy injections for long term control if above medications do not help the symptoms.  Breathing  May use Airsupra rescue inhaler 2 puffs every 4 to 6 hours as needed for shortness of breath, chest tightness, coughing, and wheezing. Do not use more than 12 puffs in 24 hours. Monitor frequency of use. Rinse mouth after each use.   Follow up in 6 months or sooner if needed.    Reducing Pollen Exposure Pollen seasons: trees (spring), grass (summer) and ragweed/weeds (fall). Keep windows closed in your home and car to lower pollen exposure.  Install air conditioning in the bedroom and throughout the house if possible.  Avoid going out in dry windy days - especially early morning. Pollen counts are highest between 5 - 10 AM and on dry, hot and windy days.  Save outside activities for late afternoon or after a heavy rain, when pollen levels are lower.  Avoid mowing of grass if you have grass pollen allergy. Be aware that pollen can also be transported indoors on people and pets.  Dry your clothes in an automatic dryer rather  than hanging them outside where they might collect pollen.  Rinse hair and eyes before bedtime.  Cockroach Allergen Avoidance Cockroaches are often found in the homes of densely populated urban areas, schools or commercial buildings, but these creatures can lurk almost anywhere. This does not mean that you have a dirty house or living area. Block all areas where roaches can enter the home. This includes crevices, wall cracks and windows.  Cockroaches need water to survive, so fix and seal all leaky faucets and pipes. Have an exterminator go through the house when your family and pets are gone to eliminate any remaining roaches. Keep food in lidded containers and put pet food dishes away after your pets are done eating. Vacuum and sweep the floor after meals, and take out garbage and recyclables. Use lidded garbage containers in the kitchen. Wash dishes immediately after use and clean under stoves, refrigerators or toasters where crumbs can accumulate. Wipe off the stove and other kitchen surfaces and cupboards regularly.  Control of House Dust Mite Allergen Dust mite allergens are a common trigger of allergy and asthma symptoms. While they can be found throughout the house, these microscopic creatures thrive in warm, humid environments such as bedding, upholstered furniture and carpeting. Because so much time is spent in the bedroom, it is essential to reduce mite levels there.  Encase pillows, mattresses, and box springs in special allergen-proof fabric covers or airtight, zippered plastic covers.  Bedding should be washed weekly in hot water (130 F) and dried in a hot dryer. Allergen-proof covers are available for comforters and pillows that can't be regularly washed.  Wash the allergy-proof covers every few months. Minimize clutter in the bedroom. Keep pets out of the bedroom.  Keep humidity less than 50% by using a dehumidifier or air conditioning. You can buy a humidity measuring device called a  hygrometer to monitor this.  If possible, replace carpets with hardwood, linoleum, or washable area rugs. If that's not possible, vacuum frequently with a vacuum that has a HEPA filter. Remove all upholstered furniture and non-washable window drapes from the bedroom. Remove all non-washable stuffed toys from the bedroom.  Wash stuffed toys weekly.  Pet Allergen Avoidance: Contrary to popular opinion, there are no "hypoallergenic" breeds of dogs or cats. That is because people are not allergic to an animal's hair, but to an allergen found in the animal's saliva, dander (dead skin flakes) or urine. Pet allergy symptoms typically occur within minutes. For some people, symptoms can build up and become most severe 8 to 12 hours after contact with the animal. People with severe allergies can experience reactions in public places if dander has been transported on the pet owners' clothing. Keeping an animal outdoors is only a partial solution, since homes with pets in the yard still have higher concentrations of animal allergens. Before getting a pet, ask your allergist to determine if you are allergic to animals. If your pet is already considered part of your family, try to minimize contact and keep the pet out of the bedroom and other rooms where you spend a great deal of time. As with dust mites, vacuum carpets often or replace carpet with a hardwood floor, tile or linoleum. High-efficiency particulate air (HEPA) cleaners can reduce allergen levels over time. While dander and saliva are the source of cat and dog allergens, urine is the source of allergens from rabbits, hamsters, mice and [place] pigs; so ask a non-allergic family member to clean the animal's cage. If you have a pet allergy, talk to your allergist about the potential for allergy immunotherapy (allergy shots). This strategy can often provide long-term relief.

## 2024-02-03 ENCOUNTER — Other Ambulatory Visit: Payer: Self-pay

## 2024-02-03 MED ORDER — IPRATROPIUM BROMIDE 0.06 % NA SOLN: 1.0000 | Freq: Four times a day (QID) | NASAL | 11 refills | Status: DC | PRN

## 2024-02-03 NOTE — Telephone Encounter (Signed)
Received a fax from [facility] that patient is requesting his ipratropium be sent to [facility]. It has been sent to [facility]. I called patient and left a message to call the office back to inform.

## 2024-02-12 ENCOUNTER — Other Ambulatory Visit: Payer: Self-pay

## 2024-03-31 ENCOUNTER — Other Ambulatory Visit: Payer: Self-pay | Admitting: Cardiology

## 2024-03-31 DIAGNOSIS — I2510 Atherosclerotic heart disease of native coronary artery without angina pectoris: Secondary | ICD-10-CM

## 2024-03-31 DIAGNOSIS — E7849 Other hyperlipidemia: Secondary | ICD-10-CM

## 2024-04-23 ENCOUNTER — Other Ambulatory Visit: Payer: Self-pay | Admitting: Allergy

## 2024-06-24 ENCOUNTER — Other Ambulatory Visit: Payer: Self-pay | Admitting: Cardiology

## 2024-06-24 DIAGNOSIS — I2510 Atherosclerotic heart disease of native coronary artery without angina pectoris: Secondary | ICD-10-CM

## 2024-06-24 DIAGNOSIS — E7849 Other hyperlipidemia: Secondary | ICD-10-CM

## 2024-07-15 ENCOUNTER — Encounter: Payer: Self-pay | Admitting: Allergy

## 2024-07-15 ENCOUNTER — Telehealth: Payer: Self-pay | Admitting: Allergy

## 2024-07-15 ENCOUNTER — Ambulatory Visit (INDEPENDENT_AMBULATORY_CARE_PROVIDER_SITE_OTHER): Payer: Medicare Other | Admitting: Allergy

## 2024-07-15 ENCOUNTER — Other Ambulatory Visit: Payer: Self-pay

## 2024-07-15 VITALS — BP 130/78 | HR 67 | Temp 98.3°F | Resp 18 | Ht 69.29 in | Wt 156.0 lb

## 2024-07-15 DIAGNOSIS — R0989 Other specified symptoms and signs involving the circulatory and respiratory systems: Secondary | ICD-10-CM | POA: Diagnosis not present

## 2024-07-15 DIAGNOSIS — J3089 Other allergic rhinitis: Secondary | ICD-10-CM | POA: Diagnosis not present

## 2024-07-15 DIAGNOSIS — J3081 Allergic rhinitis due to animal (cat) (dog) hair and dander: Secondary | ICD-10-CM

## 2024-07-15 DIAGNOSIS — R12 Heartburn: Secondary | ICD-10-CM

## 2024-07-15 DIAGNOSIS — J301 Allergic rhinitis due to pollen: Secondary | ICD-10-CM | POA: Diagnosis not present

## 2024-07-15 DIAGNOSIS — Z91038 Other insect allergy status: Secondary | ICD-10-CM

## 2024-07-15 DIAGNOSIS — J4520 Mild intermittent asthma, uncomplicated: Secondary | ICD-10-CM

## 2024-07-15 MED ORDER — BUDESONIDE-FORMOTEROL FUMARATE 80-4.5 MCG/ACT IN AERO
2.0000 | INHALATION_SPRAY | Freq: Two times a day (BID) | RESPIRATORY_TRACT | 3 refills | Status: DC
Start: 2024-07-15 — End: 2024-11-16

## 2024-07-15 NOTE — Progress Notes (Signed)
 Follow Up Note  RE: Rick Love MRN: [id number] DOB: Jul 08, 1951 Date of Office Visit: 07/15/2024  Referring provider: Lanis Thresa JAYSON DEVONNA Primary care provider: Lanis Thresa JAYSON, PA-C  Chief Complaint: Allergic Rhinitis  (2 Months constant cough with flem - ipratropium nasal spray helps some )  History of Present Illness: I had the pleasure of seeing Rick Love for a follow up visit at the [facility] on 07/15/2024. He is a 73 y.o. male, who is being followed for allergic rhinitis and asthma. His previous allergy  office visit was on 01/15/2024 with Dr. Luke. Today is a regular follow up visit.  Discussed the use of AI scribe software for clinical note transcription with the patient, who gave verbal consent to proceed.    For the past few months, he has experienced a persistent cough with phlegm that occurs throughout the day and night. The cough began before a recent trip to [place], [place], and [place]. During the trip, he experienced fever and chills on July 1st and 2nd, which resolved upon returning home. He has used his rescue inhaler, Air Supra, without relief, and prednisone was prescribed but did not alleviate the cough.  He is currently taking Singulair , ipratropium bromide  (one spray per nostril a couple of times a day), and Zyrtec. He mentions having sinus drainage issues, particularly when eating, and a scratchy throat prior to his trip. No current fever, chills.   He has a history of GERD and he avoids eating after 6 PM to manage these conditions. He no longer has pets, as his dog passed away in 04-13-2024.   He reports a history of constipation, which was also present during his last visit.     Assessment and Plan: Rick Love is a 73 y.o. male with: Chronic throat clearing Chronic cough/throat clearing with phlegm unresponsive to prednisone, antibiotics or Air Supra. History of GERD.  Refer to ENT.  If persistent with unremarkable work up  consider switching lisinopril as that sometimes can cause a nagging cough.   Seasonal allergic rhinitis due to pollen Allergic rhinitis due to dust mite Allergy  to cockroaches Past history - perennial rhinitis symptoms for 30+ years.  Used to be on AIT and last injection was in 2020 for dust mites and dog.  Unsure if effective.  1 dog at home.  Currently using ipratropium nasal spray and over-the-counter antihistamines as needed with good benefit. 2024 skin testing positive to trees, ragweed, weed, dust mites, dog, cockroach. Interim history - some rhinorrhea and PND which may be contributing to above symptoms.  Continue environmental control measures. Use over the counter antihistamines such as Zyrtec (cetirizine), Claritin (loratadine), Allegra (fexofenadine), or Xyzal (levocetirizine) daily as needed. May take twice a day during allergy  flares. May switch antihistamines every few months. Continue Singulair  (montelukast ) 10mg  daily at night. Use Atrovent  (ipratropium) 0.06% 1-2 sprays per nostril up to four times a day as needed for runny nose/drainage OR Use azelastine  nasal spray 1-2 sprays per nostril twice a day as needed for runny nose/drainage. Nasal saline spray (i.e., Simply Saline) or nasal saline lavage (i.e., NeilMed) is recommended as needed and prior to medicated nasal sprays. Consider allergy  injections for long term control if above medications do not help the symptoms.   Mild intermittent asthma Past history - Coughing and wheezing about once per week and does not use albuterol  as it does not seem to be effective. Patient tried various inhalers in the past such as Symbicort  with minimal benefit.  2024 spirometry shows some restriction. Interim history - recently had some type of respiratory infection requiring prednisone and antibiotics after traveling to [place].  Today's spirometry consistent with possible restrictive disease with 6% and 110cc improvement in FEV1 post bronchodilator  treatment. Clinically feeling unchanged.  Daily controller medication(s): start Symbicort  80mcg 2 puffs twice and rinse mouth afterwards. Use for 1 month and then stop.  May use albuterol  rescue inhaler 2 puffs every 4 to 6 hours as needed for shortness of breath, chest tightness, coughing, and wheezing.  Monitor frequency of use - if you need to use it more than twice per week on a consistent basis let us  know.   Heartburn See handout for lifestyle and dietary modifications.  Return in about 4 months (around 11/15/2024).  Meds ordered this encounter  Medications   budesonide -formoterol  (SYMBICORT ) 80-4.5 MCG/ACT inhaler    Sig: Inhale 2 puffs into the lungs in the morning and at bedtime. with spacer and rinse mouth afterwards.    Dispense:  1 each    Refill:  3   Lab Orders  No laboratory test(s) ordered today    Diagnostics: Spirometry:  Tracings reviewed. His effort: Good reproducible efforts. FVC: 2.22L FEV1: 1.61L, 52% predicted FEV1/FVC ratio: 73% Interpretation: Spirometry consistent with possible restrictive disease with 6% and 110cc improvement in FEV1 post bronchodilator treatment. Clinically feeling unchanged.   Please see scanned spirometry results for details.  Results discussed with patient/family.   Medication List:  Current Outpatient Medications  Medication Sig Dispense Refill   budesonide -formoterol  (SYMBICORT ) 80-4.5 MCG/ACT inhaler Inhale 2 puffs into the lungs in the morning and at bedtime. with spacer and rinse mouth afterwards. 1 each 3   cetirizine (ZYRTEC) 5 MG tablet Take 5 mg by mouth daily.     Evolocumab  (REPATHA  SURECLICK) 140 MG/ML SOAJ INJECT 1 PEN. INTO THE SKIN EVERY 14 (FOURTEEN) DAYS. 6 mL 0   ipratropium (ATROVENT ) 0.06 % nasal spray Place 1-2 sprays into both nostrils 4 (four) times daily as needed (runny nose/drainage). 15 mL 11   lisinopril (ZESTRIL) 5 MG tablet Take 5 mg by mouth daily.     montelukast  (SINGULAIR ) 10 MG tablet TAKE 1  TABLET BY MOUTH AT  BEDTIME 90 tablet 3   Rosuvastatin Calcium 40 MG CPSP Take 1 tablet by mouth daily.     albuterol  (PROVENTIL  HFA) 108 (90 BASE) MCG/ACT inhaler INHALE TWO PUFFS EVERY FOUR TO SIX HOURS AS NEEDED FOR COUGH OR WHEEZE. (Patient not taking: Reported on 01/15/2024) 1 Inhaler 1   azelastine  (ASTELIN ) 0.1 % nasal spray Place 1-2 sprays into both nostrils 2 (two) times daily as needed (nasal drainage). Use in each nostril as directed (Patient not taking: Reported on 07/15/2024) 30 mL 5   rosuvastatin (CRESTOR) 20 MG tablet Take 40 mg by mouth daily.     No current facility-administered medications for this visit.   Allergies: Allergies  Allergen Reactions   Other Dermatitis   Tape Rash   I reviewed his past medical history, social history, family history, and environmental history and no significant changes have been reported from his previous visit.  Review of Systems  Constitutional:  Negative for appetite change, chills, fever and unexpected weight change.  HENT:  Positive for postnasal drip and rhinorrhea. Negative for congestion.   Eyes:  Negative for itching.  Respiratory:  Positive for cough. Negative for chest tightness, shortness of breath and wheezing.   Cardiovascular:  Negative for chest pain.  Gastrointestinal:  Positive for constipation. Negative for  abdominal pain.  Genitourinary:  Negative for difficulty urinating.  Skin:  Negative for rash.  Allergic/Immunologic: Positive for environmental allergies.  Neurological:  Negative for headaches.    Objective: BP 130/78 (BP Location: Right Arm, Patient Position: Sitting, Cuff Size: Normal)   Pulse 67   Temp 98.3 F (36.8 C) (Temporal)   Resp 18   Ht 5' 9.29 (1.76 m)   Wt 156 lb (70.8 kg)   SpO2 95%   BMI 22.84 kg/m  Body mass index is 22.84 kg/m. Physical Exam Vitals and nursing note reviewed.  Constitutional:      Appearance: Normal appearance. He is well-developed.  HENT:     Head: Normocephalic  and atraumatic.     Right Ear: Tympanic membrane and external ear normal.     Left Ear: Tympanic membrane and external ear normal.     Nose: Nose normal.     Mouth/Throat:     Mouth: Mucous membranes are moist.     Pharynx: Oropharynx is clear.  Eyes:     Conjunctiva/sclera: Conjunctivae normal.  Cardiovascular:     Rate and Rhythm: Normal rate and regular rhythm.     Heart sounds: Normal heart sounds. No murmur heard.    No friction rub. No gallop.  Pulmonary:     Effort: Pulmonary effort is normal.     Breath sounds: Normal breath sounds. No wheezing, rhonchi or rales.  Musculoskeletal:     Cervical back: Neck supple.  Skin:    General: Skin is warm.     Findings: No rash.  Neurological:     Mental Status: He is alert and oriented to person, place, and time.  Psychiatric:        Behavior: Behavior normal.    Previous notes and tests were reviewed. The plan was reviewed with the patient/family, and all questions/concerned were addressed.  It was my pleasure to see Rick Love today and participate in his care. Please feel free to contact me with any questions or concerns.  Sincerely,  Orlan Cramp, DO Allergy  & Immunology  [facility] [place]  [place] office: [phone] [facility] office: [phone]

## 2024-07-15 NOTE — Patient Instructions (Addendum)
 Environmental allergies 2024 skin testing positive to trees, ragweed, weed, dust mites, dog, cockroach. Continue environmental control measures. Use over the counter antihistamines such as Zyrtec (cetirizine), Claritin (loratadine), Allegra (fexofenadine), or Xyzal (levocetirizine) daily as needed. May take twice a day during allergy  flares. May switch antihistamines every few months. Continue Singulair  (montelukast ) 10mg  daily at night. Use Atrovent  (ipratropium) 0.06% 1-2 sprays per nostril up to four times a day as needed for runny nose/drainage OR Use azelastine  nasal spray 1-2 sprays per nostril twice a day as needed for runny nose/drainage. Refer ENT - for chronic cough and throat clearing.   Nasal saline spray (i.e., Simply Saline) or nasal saline lavage (i.e., NeilMed) is recommended as needed and prior to medicated nasal sprays. Consider allergy  injections for long term control if above medications do not help the symptoms.  Heartburn See handout for lifestyle and dietary modifications.  Breathing  Daily controller medication(s): start Symbicort  80mcg 2 puffs twice and rinse mouth afterwards. Use for 1 month and then stop.  May use albuterol  rescue inhaler 2 puffs every 4 to 6 hours as needed for shortness of breath, chest tightness, coughing, and wheezing.  Monitor frequency of use - if you need to use it more than twice per week on a consistent basis let us  know.  Breathing control goals:  Full participation in all desired activities (may need albuterol  before activity) Albuterol  use two times or less a week on average (not counting use with activity) Cough interfering with sleep two times or less a month Oral steroids no more than once a year No hospitalizations   Follow up in 4 months or sooner if needed.    Reducing Pollen Exposure Pollen seasons: trees (spring), grass (summer) and ragweed/weeds (fall). Keep windows closed in your home and car to lower pollen exposure.   Install air conditioning in the bedroom and throughout the house if possible.  Avoid going out in dry windy days - especially early morning. Pollen counts are highest between 5 - 10 AM and on dry, hot and windy days.  Save outside activities for late afternoon or after a heavy rain, when pollen levels are lower.  Avoid mowing of grass if you have grass pollen allergy . Be aware that pollen can also be transported indoors on people and pets.  Dry your clothes in an automatic dryer rather than hanging them outside where they might collect pollen.  Rinse hair and eyes before bedtime.  Cockroach Allergen Avoidance Cockroaches are often found in the homes of densely populated urban areas, schools or commercial buildings, but these creatures can lurk almost anywhere. This does not mean that you have a dirty house or living area. Block all areas where roaches can enter the home. This includes crevices, wall cracks and windows.  Cockroaches need water to survive, so fix and seal all leaky faucets and pipes. Have an exterminator go through the house when your family and pets are gone to eliminate any remaining roaches. Keep food in lidded containers and put pet food dishes away after your pets are done eating. Vacuum and sweep the floor after meals, and take out garbage and recyclables. Use lidded garbage containers in the kitchen. Wash dishes immediately after use and clean under stoves, refrigerators or toasters where crumbs can accumulate. Wipe off the stove and other kitchen surfaces and cupboards regularly.  Control of House Dust Mite Allergen Dust mite allergens are a common trigger of allergy  and asthma symptoms. While they can be found throughout the house,  these microscopic creatures thrive in warm, humid environments such as bedding, upholstered furniture and carpeting. Because so much time is spent in the bedroom, it is essential to reduce mite levels there.  Encase pillows, mattresses, and box  springs in special allergen-proof fabric covers or airtight, zippered plastic covers.  Bedding should be washed weekly in hot water (130 F) and dried in a hot dryer. Allergen-proof covers are available for comforters and pillows that can't be regularly washed.  Wash the allergy -proof covers every few months. Minimize clutter in the bedroom. Keep pets out of the bedroom.  Keep humidity less than 50% by using a dehumidifier or air conditioning. You can buy a humidity measuring device called a hygrometer to monitor this.  If possible, replace carpets with hardwood, linoleum, or washable area rugs. If that's not possible, vacuum frequently with a vacuum that has a HEPA filter. Remove all upholstered furniture and non-washable window drapes from the bedroom. Remove all non-washable stuffed toys from the bedroom.  Wash stuffed toys weekly.  Pet Allergen Avoidance: Contrary to popular opinion, there are no "hypoallergenic" breeds of dogs or cats. That is because people are not allergic to an animal's hair, but to an allergen found in the animal's saliva, dander (dead skin flakes) or urine. Pet allergy  symptoms typically occur within minutes. For some people, symptoms can build up and become most severe 8 to 12 hours after contact with the animal. People with severe allergies can experience reactions in public places if dander has been transported on the pet owners' clothing. Keeping an animal outdoors is only a partial solution, since homes with pets in the yard still have higher concentrations of animal allergens. Before getting a pet, ask your allergist to determine if you are allergic to animals. If your pet is already considered part of your family, try to minimize contact and keep the pet out of the bedroom and other rooms where you spend a great deal of time. As with dust mites, vacuum carpets often or replace carpet with a hardwood floor, tile or linoleum. High-efficiency particulate air (HEPA)  cleaners can reduce allergen levels over time. While dander and saliva are the source of cat and dog allergens, urine is the source of allergens from rabbits, hamsters, mice and [place] pigs; so ask a non-allergic family member to clean the animal's cage. If you have a pet allergy , talk to your allergist about the potential for allergy  immunotherapy (allergy  shots). This strategy can often provide long-term relief.

## 2024-07-15 NOTE — Telephone Encounter (Signed)
 Refer to ENT for chronic cough and throat clearing. Thank you.

## 2024-07-16 ENCOUNTER — Encounter (INDEPENDENT_AMBULATORY_CARE_PROVIDER_SITE_OTHER): Payer: Self-pay

## 2024-07-16 ENCOUNTER — Other Ambulatory Visit: Payer: Self-pay | Admitting: Allergy

## 2024-07-27 ENCOUNTER — Encounter (INDEPENDENT_AMBULATORY_CARE_PROVIDER_SITE_OTHER): Payer: Self-pay

## 2024-08-31 NOTE — Telephone Encounter (Signed)
  ENT closed out [place]'s referral due to unable to contact

## 2024-09-15 ENCOUNTER — Other Ambulatory Visit: Payer: Self-pay | Admitting: Cardiology

## 2024-09-15 DIAGNOSIS — E7849 Other hyperlipidemia: Secondary | ICD-10-CM

## 2024-09-15 DIAGNOSIS — I2510 Atherosclerotic heart disease of native coronary artery without angina pectoris: Secondary | ICD-10-CM

## 2024-09-21 ENCOUNTER — Encounter: Payer: Self-pay | Admitting: Cardiology

## 2024-09-22 ENCOUNTER — Other Ambulatory Visit (HOSPITAL_COMMUNITY): Payer: Self-pay

## 2024-09-22 NOTE — Telephone Encounter (Signed)
 Pharmacy Patient Advocate Encounter   Received notification from Physician's Office that prior authorization for REPATHA  is required/requested.   Insurance verification completed.   The patient is insured through [facility].   Per test claim: PA required; PA submitted to above mentioned insurance via Latent Key/confirmation #/[id number] Status is pending

## 2024-09-24 ENCOUNTER — Other Ambulatory Visit (HOSPITAL_COMMUNITY): Payer: Self-pay

## 2024-09-24 NOTE — Telephone Encounter (Signed)
 Pharmacy Patient Advocate Encounter  Received notification from [employer] that Prior Authorization for REPATHA  has been APPROVED from 09/22/24 to 12/22/25. Unable to obtain price due to refill too soon rejection, last fill date 09/23/24 next available fill date 11/25/24

## 2024-10-08 ENCOUNTER — Other Ambulatory Visit: Payer: Self-pay | Admitting: Medical Genetics

## 2024-10-08 DIAGNOSIS — Z006 Encounter for examination for normal comparison and control in clinical research program: Secondary | ICD-10-CM

## 2024-11-15 LAB — GENECONNECT MOLECULAR SCREEN: Genetic Analysis Overall Interpretation: NEGATIVE

## 2024-11-15 NOTE — Progress Notes (Unsigned)
 Follow Up Note  RE: Rick Love MRN: [id number] DOB: 1951/04/08 Date of Office Visit: 11/16/2024  Referring provider: Lanis Thresa JAYSON DEVONNA Primary care provider: Lanis Thresa JAYSON, PA-C  Chief Complaint: No chief complaint on file.  History of Present Illness: I had the pleasure of seeing Rick Love for a follow up visit at the [facility] on 11/16/2024. He is a 73 y.o. male, who is being followed for allergic rhinitis, asthma, throat clearing and heartburn. His previous allergy  office visit was on 07/15/2024 with Dr. Luke. Today is a regular follow up visit.  Discussed the use of AI scribe software for clinical note transcription with the patient, who gave verbal consent to proceed.  History of Present Illness            ENT referral was closed. Checks x-ray?  Assessment and Plan: [name] is a 73 y.o. male with: Chronic throat clearing Chronic cough/throat clearing with phlegm unresponsive to prednisone, antibiotics or Air Supra. History of GERD.  Refer to ENT.  If persistent with unremarkable work up consider switching lisinopril as that sometimes can cause a nagging cough.    Seasonal allergic rhinitis due to pollen Allergic rhinitis due to dust mite Allergy  to cockroaches Past history - perennial rhinitis symptoms for 30+ years.  Used to be on AIT and last injection was in 2020 for dust mites and dog.  Unsure if effective.  1 dog at home.  Currently using ipratropium nasal spray and over-the-counter antihistamines as needed with good benefit. 2024 skin testing positive to trees, ragweed, weed, dust mites, dog, cockroach. Interim history - some rhinorrhea and PND which may be contributing to above symptoms.  Continue environmental control measures. Use over the counter antihistamines such as Zyrtec (cetirizine), Claritin (loratadine), Allegra (fexofenadine), or Xyzal (levocetirizine) daily as needed. May take twice a day during allergy  flares.  May switch antihistamines every few months. Continue Singulair  (montelukast ) 10mg  daily at night. Use Atrovent  (ipratropium) 0.06% 1-2 sprays per nostril up to four times a day as needed for runny nose/drainage OR Use azelastine  nasal spray 1-2 sprays per nostril twice a day as needed for runny nose/drainage. Nasal saline spray (i.e., Simply Saline) or nasal saline lavage (i.e., NeilMed) is recommended as needed and prior to medicated nasal sprays. Consider allergy  injections for long term control if above medications do not help the symptoms.   Mild intermittent asthma Past history - Coughing and wheezing about once per week and does not use albuterol  as it does not seem to be effective. Patient tried various inhalers in the past such as Symbicort  with minimal benefit. 2024 spirometry shows some restriction. Interim history - recently had some type of respiratory infection requiring prednisone and antibiotics after traveling to [place].  Today's spirometry consistent with possible restrictive disease with 6% and 110cc improvement in FEV1 post bronchodilator treatment. Clinically feeling unchanged.  Daily controller medication(s): start Symbicort  80mcg 2 puffs twice and rinse mouth afterwards. Use for 1 month and then stop.  May use albuterol  rescue inhaler 2 puffs every 4 to 6 hours as needed for shortness of breath, chest tightness, coughing, and wheezing.  Monitor frequency of use - if you need to use it more than twice per week on a consistent basis let us  know.    Heartburn See handout for lifestyle and dietary modifications. Assessment and Plan              No follow-ups on file.  No orders of the defined  types were placed in this encounter.  Lab Orders  No laboratory test(s) ordered today    Diagnostics: Spirometry:  Tracings reviewed. His effort: {Blank single:19197::Good reproducible efforts.,It was hard to get consistent efforts and there is a question as to whether this  reflects a maximal maneuver.,Poor effort, data can not be interpreted.} FVC: ***L FEV1: ***L, ***% predicted FEV1/FVC ratio: ***% Interpretation: {Blank single:19197::Spirometry consistent with mild obstructive disease,Spirometry consistent with moderate obstructive disease,Spirometry consistent with severe obstructive disease,Spirometry consistent with possible restrictive disease,Spirometry consistent with mixed obstructive and restrictive disease,Spirometry uninterpretable due to technique,Spirometry consistent with normal pattern,No overt abnormalities noted given today's efforts}.  Please see scanned spirometry results for details.  Skin Testing: {Blank single:19197::Select foods,Environmental allergy  panel,Environmental allergy  panel and select foods,Food allergy  panel,None,Deferred due to recent antihistamines use}. *** Results discussed with patient/family.   Medication List:  Current Outpatient Medications  Medication Sig Dispense Refill   albuterol  (PROVENTIL  HFA) 108 (90 BASE) MCG/ACT inhaler INHALE TWO PUFFS EVERY FOUR TO SIX HOURS AS NEEDED FOR COUGH OR WHEEZE. (Patient not taking: Reported on 01/15/2024) 1 Inhaler 1   azelastine  (ASTELIN ) 0.1 % nasal spray Place 1-2 sprays into both nostrils 2 (two) times daily as needed (nasal drainage). Use in each nostril as directed (Patient not taking: Reported on 07/15/2024) 30 mL 5   budesonide -formoterol  (SYMBICORT ) 80-4.5 MCG/ACT inhaler Inhale 2 puffs into the lungs in the morning and at bedtime. with spacer and rinse mouth afterwards. 1 each 3   cetirizine (ZYRTEC) 5 MG tablet Take 5 mg by mouth daily.     Evolocumab  (REPATHA  SURECLICK) 140 MG/ML SOAJ INJECT 1 PEN. INTO THE SKIN EVERY 14 (FOURTEEN) DAYS. 6 mL 3   ipratropium (ATROVENT ) 0.06 % nasal spray Place 1-2 sprays into both nostrils 4 (four) times daily as needed (runny nose/drainage). 15 mL 11   lisinopril (ZESTRIL) 5 MG tablet Take 5 mg by mouth  daily.     montelukast  (SINGULAIR ) 10 MG tablet TAKE 1 TABLET BY MOUTH AT  BEDTIME 90 tablet 3   rosuvastatin (CRESTOR) 20 MG tablet Take 40 mg by mouth daily.     Rosuvastatin Calcium 40 MG CPSP Take 1 tablet by mouth daily.     No current facility-administered medications for this visit.   Allergies: Allergies  Allergen Reactions   Other Dermatitis   Tape Rash   I reviewed his past medical history, social history, family history, and environmental history and no significant changes have been reported from his previous visit.  Review of Systems  Constitutional:  Negative for appetite change, chills, fever and unexpected weight change.  HENT:  Positive for postnasal drip and rhinorrhea. Negative for congestion.   Eyes:  Negative for itching.  Respiratory:  Positive for cough. Negative for chest tightness, shortness of breath and wheezing.   Cardiovascular:  Negative for chest pain.  Gastrointestinal:  Positive for constipation. Negative for abdominal pain.  Genitourinary:  Negative for difficulty urinating.  Skin:  Negative for rash.  Allergic/Immunologic: Positive for environmental allergies.  Neurological:  Negative for headaches.    Objective: There were no vitals taken for this visit. There is no height or weight on file to calculate BMI. Physical Exam Vitals and nursing note reviewed.  Constitutional:      Appearance: Normal appearance. He is well-developed.  HENT:     Head: Normocephalic and atraumatic.     Right Ear: Tympanic membrane and external ear normal.     Left Ear: Tympanic membrane and external ear normal.  Nose: Nose normal.     Mouth/Throat:     Mouth: Mucous membranes are moist.     Pharynx: Oropharynx is clear.  Eyes:     Conjunctiva/sclera: Conjunctivae normal.  Cardiovascular:     Rate and Rhythm: Normal rate and regular rhythm.     Heart sounds: Normal heart sounds. No murmur heard.    No friction rub. No gallop.  Pulmonary:     Effort:  Pulmonary effort is normal.     Breath sounds: Normal breath sounds. No wheezing, rhonchi or rales.  Musculoskeletal:     Cervical back: Neck supple.  Skin:    General: Skin is warm.     Findings: No rash.  Neurological:     Mental Status: He is alert and oriented to person, place, and time.  Psychiatric:        Behavior: Behavior normal.    Previous notes and tests were reviewed. The plan was reviewed with the patient/family, and all questions/concerned were addressed.  It was my pleasure to see Masyn today and participate in his care. Please feel free to contact me with any questions or concerns.  Sincerely,  Orlan Cramp, DO Allergy  & Immunology  [facility]  [place] office: [phone] [facility] office: [phone]

## 2024-11-16 ENCOUNTER — Other Ambulatory Visit: Payer: Self-pay

## 2024-11-16 ENCOUNTER — Ambulatory Visit (INDEPENDENT_AMBULATORY_CARE_PROVIDER_SITE_OTHER): Admitting: Allergy

## 2024-11-16 ENCOUNTER — Encounter: Payer: Self-pay | Admitting: Allergy

## 2024-11-16 VITALS — BP 120/62 | HR 49 | Temp 97.6°F | Resp 14 | Ht 69.0 in | Wt 169.8 lb

## 2024-11-16 DIAGNOSIS — J3089 Other allergic rhinitis: Secondary | ICD-10-CM

## 2024-11-16 DIAGNOSIS — J4520 Mild intermittent asthma, uncomplicated: Secondary | ICD-10-CM

## 2024-11-16 DIAGNOSIS — R12 Heartburn: Secondary | ICD-10-CM | POA: Diagnosis not present

## 2024-11-16 DIAGNOSIS — J302 Other seasonal allergic rhinitis: Secondary | ICD-10-CM

## 2024-11-16 DIAGNOSIS — H1013 Acute atopic conjunctivitis, bilateral: Secondary | ICD-10-CM

## 2024-11-16 DIAGNOSIS — R0989 Other specified symptoms and signs involving the circulatory and respiratory systems: Secondary | ICD-10-CM | POA: Diagnosis not present

## 2024-11-16 DIAGNOSIS — J4530 Mild persistent asthma, uncomplicated: Secondary | ICD-10-CM | POA: Diagnosis not present

## 2024-11-16 DIAGNOSIS — H1010 Acute atopic conjunctivitis, unspecified eye: Secondary | ICD-10-CM

## 2024-11-16 MED ORDER — IPRATROPIUM BROMIDE 0.06 % NA SOLN: 1.0000 | Freq: Four times a day (QID) | NASAL | 11 refills | Status: AC | PRN

## 2024-11-16 MED ORDER — MONTELUKAST SODIUM 10 MG PO TABS: 10.0000 mg | ORAL_TABLET | Freq: Every day | ORAL | 3 refills | Status: AC

## 2024-11-16 MED ORDER — BUDESONIDE-FORMOTEROL FUMARATE 80-4.5 MCG/ACT IN AERO: 2.0000 | INHALATION_SPRAY | Freq: Two times a day (BID) | RESPIRATORY_TRACT | 3 refills | Status: AC

## 2024-11-16 NOTE — Patient Instructions (Addendum)
 Environmental allergies 2024 skin testing positive to trees, ragweed, weed, dust mites, dog, cockroach. Continue environmental control measures. Use over the counter antihistamines such as Zyrtec (cetirizine), Claritin (loratadine), Allegra (fexofenadine), or Xyzal (levocetirizine) daily as needed. May take twice a day during allergy  flares. May switch antihistamines every few months. Continue Singulair  (montelukast ) 10mg  daily at night. Use Atrovent  (ipratropium) 0.06% 1-2 sprays per nostril up to four times a day as needed for runny nose/drainage  Nasal saline spray (i.e., Simply Saline) or nasal saline lavage (i.e., NeilMed) is recommended as needed and prior to medicated nasal sprays. Consider allergy  injections for long term control if above medications do not help the symptoms.  Heartburn Continue lifestyle and dietary modifications.  Breathing/coughing If symptoms worsen, we may need to get a chest X-ray next.  Daily controller medication(s): start Symbicort  80mcg 2 puffs once per day and rinse mouth afterwards. During respiratory infections/flares:  Start Symbicort  80mcg 2 puffs twice a day for 1-2 weeks until your breathing symptoms return to baseline.  May use albuterol  rescue inhaler 2 puffs every 4 to 6 hours as needed for shortness of breath, chest tightness, coughing, and wheezing.  Monitor frequency of use - if you need to use it more than twice per week on a consistent basis let us  know.  Breathing control goals:  Full participation in all desired activities (may need albuterol  before activity) Albuterol  use two times or less a week on average (not counting use with activity) Cough interfering with sleep two times or less a month Oral steroids no more than once a year No hospitalizations   Follow up in 4 months or sooner if needed.    Reducing Pollen Exposure Pollen seasons: trees (spring), grass (summer) and ragweed/weeds (fall). Keep windows closed in your home and car  to lower pollen exposure.  Install air conditioning in the bedroom and throughout the house if possible.  Avoid going out in dry windy days - especially early morning. Pollen counts are highest between 5 - 10 AM and on dry, hot and windy days.  Save outside activities for late afternoon or after a heavy rain, when pollen levels are lower.  Avoid mowing of grass if you have grass pollen allergy . Be aware that pollen can also be transported indoors on people and pets.  Dry your clothes in an automatic dryer rather than hanging them outside where they might collect pollen.  Rinse hair and eyes before bedtime.  Cockroach Allergen Avoidance Cockroaches are often found in the homes of densely populated urban areas, schools or commercial buildings, but these creatures can lurk almost anywhere. This does not mean that you have a dirty house or living area. Block all areas where roaches can enter the home. This includes crevices, wall cracks and windows.  Cockroaches need water to survive, so fix and seal all leaky faucets and pipes. Have an exterminator go through the house when your family and pets are gone to eliminate any remaining roaches. Keep food in lidded containers and put pet food dishes away after your pets are done eating. Vacuum and sweep the floor after meals, and take out garbage and recyclables. Use lidded garbage containers in the kitchen. Wash dishes immediately after use and clean under stoves, refrigerators or toasters where crumbs can accumulate. Wipe off the stove and other kitchen surfaces and cupboards regularly.  Control of House Dust Mite Allergen Dust mite allergens are a common trigger of allergy  and asthma symptoms. While they can be found throughout the house,  these microscopic creatures thrive in warm, humid environments such as bedding, upholstered furniture and carpeting. Because so much time is spent in the bedroom, it is essential to reduce mite levels there.  Encase  pillows, mattresses, and box springs in special allergen-proof fabric covers or airtight, zippered plastic covers.  Bedding should be washed weekly in hot water (130 F) and dried in a hot dryer. Allergen-proof covers are available for comforters and pillows that can't be regularly washed.  Wash the allergy -proof covers every few months. Minimize clutter in the bedroom. Keep pets out of the bedroom.  Keep humidity less than 50% by using a dehumidifier or air conditioning. You can buy a humidity measuring device called a hygrometer to monitor this.  If possible, replace carpets with hardwood, linoleum, or washable area rugs. If that's not possible, vacuum frequently with a vacuum that has a HEPA filter. Remove all upholstered furniture and non-washable window drapes from the bedroom. Remove all non-washable stuffed toys from the bedroom.  Wash stuffed toys weekly.  Pet Allergen Avoidance: Contrary to popular opinion, there are no "hypoallergenic" breeds of dogs or cats. That is because people are not allergic to an animal's hair, but to an allergen found in the animal's saliva, dander (dead skin flakes) or urine. Pet allergy  symptoms typically occur within minutes. For some people, symptoms can build up and become most severe 8 to 12 hours after contact with the animal. People with severe allergies can experience reactions in public places if dander has been transported on the pet owners' clothing. Keeping an animal outdoors is only a partial solution, since homes with pets in the yard still have higher concentrations of animal allergens. Before getting a pet, ask your allergist to determine if you are allergic to animals. If your pet is already considered part of your family, try to minimize contact and keep the pet out of the bedroom and other rooms where you spend a great deal of time. As with dust mites, vacuum carpets often or replace carpet with a hardwood floor, tile or linoleum. High-efficiency  particulate air (HEPA) cleaners can reduce allergen levels over time. While dander and saliva are the source of cat and dog allergens, urine is the source of allergens from rabbits, hamsters, mice and guinea pigs; so ask a non-allergic family member to clean the animal's cage. If you have a pet allergy , talk to your allergist about the potential for allergy  immunotherapy (allergy  shots). This strategy can often provide long-term relief.

## 2025-03-15 ENCOUNTER — Ambulatory Visit: Admitting: Allergy

## 2025-03-28 ENCOUNTER — Ambulatory Visit: Admitting: Cardiology
